# Patient Record
Sex: Female | Born: 1985 | Race: White | Hispanic: No | Marital: Married | Smoking: Never smoker
Health system: Southern US, Community
[De-identification: ages and names within clinical notes are randomized; demographics above are authoritative.]

## PROBLEM LIST (undated history)

## (undated) DIAGNOSIS — Z789 Other specified health status: Secondary | ICD-10-CM

## (undated) HISTORY — PX: NO PAST SURGERIES: SHX2092

---

## 2022-02-22 LAB — OB RESULTS CONSOLE HIV ANTIBODY (ROUTINE TESTING): HIV: NONREACTIVE

## 2022-02-22 LAB — OB RESULTS CONSOLE RUBELLA ANTIBODY, IGM: Rubella: IMMUNE

## 2022-02-22 LAB — OB RESULTS CONSOLE HEPATITIS B SURFACE ANTIGEN: Hepatitis B Surface Ag: NEGATIVE

## 2022-02-22 LAB — OB RESULTS CONSOLE ABO/RH: "RH Type ": NEGATIVE

## 2022-03-08 LAB — OB RESULTS CONSOLE GC/CHLAMYDIA
Chlamydia: NEGATIVE
Neisseria Gonorrhea: NEGATIVE

## 2022-05-17 ENCOUNTER — Inpatient Hospital Stay (HOSPITAL_BASED_OUTPATIENT_CLINIC_OR_DEPARTMENT_OTHER): Payer: BC Managed Care – PPO

## 2022-05-17 ENCOUNTER — Inpatient Hospital Stay (HOSPITAL_COMMUNITY)
Admission: AD | Admit: 2022-05-17 | Discharge: 2022-05-17 | Disposition: A | Discharge status: Home / Self Care | Payer: BC Managed Care – PPO | Attending: Obstetrics and Gynecology | Admitting: Obstetrics and Gynecology

## 2022-05-17 ENCOUNTER — Encounter (HOSPITAL_COMMUNITY): Payer: Self-pay | Admitting: Obstetrics and Gynecology

## 2022-05-17 ENCOUNTER — Other Ambulatory Visit: Payer: Self-pay

## 2022-05-17 DIAGNOSIS — O9A212 Injury, poisoning and certain other consequences of external causes complicating pregnancy, second trimester: Secondary | ICD-10-CM | POA: Diagnosis not present

## 2022-05-17 DIAGNOSIS — Z3A20 20 weeks gestation of pregnancy: Secondary | ICD-10-CM | POA: Diagnosis not present
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
## 2022-07-05 LAB — OB RESULTS CONSOLE HIV ANTIBODY (ROUTINE TESTING): HIV: NONREACTIVE

## 2022-07-25 ENCOUNTER — Inpatient Hospital Stay (HOSPITAL_COMMUNITY)
Admission: AD | Admit: 2022-07-25 | Discharge: 2022-07-25 | Disposition: A | Discharge status: Home / Self Care | Payer: BC Managed Care – PPO | Attending: Obstetrics and Gynecology | Admitting: Obstetrics and Gynecology

## 2022-07-25 ENCOUNTER — Encounter (HOSPITAL_COMMUNITY): Payer: Self-pay | Admitting: Obstetrics and Gynecology

## 2022-07-25 DIAGNOSIS — Z3A30 30 weeks gestation of pregnancy: Secondary | ICD-10-CM

## 2022-07-25 DIAGNOSIS — O133 Gestational [pregnancy-induced] hypertension without significant proteinuria, third trimester: Secondary | ICD-10-CM | POA: Diagnosis present

## 2022-07-25 LAB — COMPREHENSIVE METABOLIC PANEL
ALT: 27 U/L (ref 0–44)
AST: 19 U/L (ref 15–41)
Albumin: 2.6 g/dL — ABNORMAL LOW (ref 3.5–5.0)
Alkaline Phosphatase: 76 U/L (ref 38–126)
Anion gap: 9 (ref 5–15)
BUN: 8 mg/dL (ref 6–20)
CO2: 22 mmol/L (ref 22–32)
Calcium: 9 mg/dL (ref 8.9–10.3)
Chloride: 104 mmol/L (ref 98–111)
Creatinine, Ser: 0.99 mg/dL (ref 0.44–1.00)
GFR, Estimated: >60 mL/min (ref >60)
Glucose, Bld: 82 mg/dL (ref 70–99)
Potassium: 4.4 mmol/L (ref 3.5–5.1)
Sodium: 135 mmol/L (ref 135–145)
Total Bilirubin: 0.6 mg/dL (ref 0.3–1.2)
Total Protein: 6 g/dL — ABNORMAL LOW (ref 6.5–8.1)

## 2022-07-25 LAB — URINALYSIS, ROUTINE W REFLEX MICROSCOPIC
Bilirubin Urine: NEGATIVE
Glucose, UA: NEGATIVE mg/dL
Hgb urine dipstick: NEGATIVE
Ketones, ur: NEGATIVE mg/dL
Leukocytes,Ua: NEGATIVE
Nitrite: NEGATIVE
Protein, ur: NEGATIVE mg/dL
Specific Gravity, Urine: <1.005 — ABNORMAL LOW (ref 1.005–1.030)
pH: 6 (ref 5.0–8.0)

## 2022-07-25 LAB — PROTEIN / CREATININE RATIO, URINE
Creatinine, Urine: 35 mg/dL
Total Protein, Urine: <6 mg/dL

## 2022-07-25 LAB — CBC
HCT: 38.2 % (ref 36.0–46.0)
Hemoglobin: 13 g/dL (ref 12.0–15.0)
MCH: 30.9 pg (ref 26.0–34.0)
MCHC: 34 g/dL (ref 30.0–36.0)
MCV: 90.7 fL (ref 80.0–100.0)
Platelets: 212 10*3/uL (ref 150–400)
RBC: 4.21 MIL/uL (ref 3.87–5.11)
RDW: 13 % (ref 11.5–15.5)
WBC: 14.5 10*3/uL — ABNORMAL HIGH (ref 4.0–10.5)
nRBC: 0 % (ref 0.0–0.2)

## 2022-07-25 NOTE — MAU Provider Note (Signed)
History     CSN: [id number]  Arrival date and time: 07/25/22 1810   None     Chief Complaint  Patient presents with   Hypertension   HPI Jody Griffin is a 36 y.o. G1P0 at [redacted]w[redacted]d who presents to MAU for elevated BP. Patient reports she was in office on Wednesday for routine prenatal visit and BP was found to be 130s/90s. Patient reports she was instructed to keep an eye on BP's over the weekend. Took BP today and it was found 140s/90s. Went to [employer] and on the way home stopped at the fire department to have BP checked and it was found to be 160s/90s-110s. She denies headache, vision changes, RUQ/epigastric pain, or significant swelling. No contractions, vaginal bleeding, or leaking fluid. Endorses normal fetal movement.  Patient receives prenatal care at [facility].   OB History     Gravida  1   Para      Term      Preterm      AB      Living         SAB      IAB      Ectopic      Multiple      Live Births              History reviewed. No pertinent past medical history.  History reviewed. No pertinent surgical history.  Family History  Problem Relation Age of Onset   Factor V Leiden deficiency Mother    Non-Hodgkin's lymphoma Father        CLL    Social History   Tobacco Use   Smoking status: Never   Smokeless tobacco: Never  Vaping Use   Vaping Use: Never used  Substance Use Topics   Alcohol use: Never   Drug use: Never    Allergies: No Known Allergies  Medications Prior to Admission  Medication Sig Dispense Refill Last Dose   prenatal vitamin w/FE, FA (PRENATAL 1 + 1) 27-1 MG TABS tablet Take 1 tablet by mouth daily at 12 noon.      Review of Systems  Constitutional: Negative.   Respiratory: Negative.    Gastrointestinal: Negative.   Genitourinary: Negative.   Musculoskeletal: Negative.   Neurological: Negative.    Physical Exam  Patient Vitals for the past 24 hrs:  BP Temp Temp src Pulse Resp SpO2 Height  Weight  07/25/22 2000 (!) 137/101 -- -- 80 -- 98 % -- --  07/25/22 1950 -- -- -- -- -- 96 % -- --  07/25/22 1945 (!) 147/98 -- -- 76 -- 97 % -- --  07/25/22 1940 -- -- -- -- -- 97 % -- --  07/25/22 1930 (!) 142/97 -- -- 79 -- 97 % -- --  07/25/22 1920 -- -- -- -- -- 98 % -- --  07/25/22 1915 (!) 157/104 -- -- 80 -- 99 % -- --  07/25/22 1910 -- -- -- -- -- 96 % -- --  07/25/22 1905 -- -- -- -- -- 98 % -- --  07/25/22 1900 (!) 149/103 -- -- 79 -- 97 % -- --  07/25/22 1855 -- -- -- -- -- 98 % -- --  07/25/22 1850 -- -- -- -- -- 98 % -- --  07/25/22 1846 (!) 153/91 -- -- 83 -- -- -- --  07/25/22 1845 -- -- -- -- -- 98 % -- --  07/25/22 1825 (!) 153/103 98.2 F (36.8 C) Oral 77 18 99 %  5\' 11"  (1.803 m) 134.6 kg   Physical Exam Vitals and nursing note reviewed.  Constitutional:      General: She is not in acute distress.    Appearance: She is obese.  Eyes:     Extraocular Movements: Extraocular movements intact.     Pupils: Pupils are equal, round, and reactive to light.  Cardiovascular:     Rate and Rhythm: Normal rate.  Pulmonary:     Effort: Pulmonary effort is normal.  Abdominal:     Palpations: Abdomen is soft.     Tenderness: There is no abdominal tenderness.     Comments: Gravid   Musculoskeletal:        General: Normal range of motion.  Skin:    General: Skin is warm and dry.  Neurological:     General: No focal deficit present.     Mental Status: She is alert and oriented to person, place, and time.  Psychiatric:        Mood and Affect: Mood normal.        Behavior: Behavior normal.        Judgment: Judgment normal.    NST FHR: 150bpm, moderate variability, +15x15 accels, no decels Toco: quiet  MAU Course  Procedures   MDM CBC, CMP, and UPCR unremarkable BP's mild range; patient asymptomatic NST reassuring for gestational age, toco quiet Spoke with Dr. , patient to call office for BP check on Tuesday. Patient given strict return  precautions  Assessment and Plan  [redacted] weeks gestation of pregnancy Gestational hypertension  - Discharge home in stable condition - Strict return precautions reviewed - Follow up in office on Tuesday for BP check - Return to MAU sooner or as needed for new/worsening symptoms   Tuesday, CNM 07/25/22 8:26 PM

## 2022-07-25 NOTE — MAU Note (Signed)
Jody Griffin is a 36 y.o. at [redacted]w[redacted]d here in MAU reporting: BP elevated earlier this week. Has been checking at home. 148/90 at home, went to [facility] to recheck 160/100.  On call nurse instructed them to come in. Denies HA, visual changes, epigastric pain or increase in swelling. Denies vag bleeding or LOF, reports +FM.  Onset of complaint: this afternoon Pain score: none Vitals:   07/25/22 1825  BP: (!) 153/103  Pulse: 77  Resp: 18  Temp: 98.2 F (36.8 C)  SpO2: 99%     FHT:146 Lab orders placed from triage:  urine/PCR

## 2022-07-29 ENCOUNTER — Inpatient Hospital Stay (HOSPITAL_COMMUNITY)
Admission: AD | Admit: 2022-07-29 | Discharge: 2022-07-29 | Disposition: A | Discharge status: Home / Self Care | Payer: BC Managed Care – PPO | Attending: Obstetrics & Gynecology | Admitting: Obstetrics & Gynecology

## 2022-07-29 ENCOUNTER — Encounter (HOSPITAL_COMMUNITY): Payer: Self-pay | Admitting: Obstetrics & Gynecology

## 2022-07-29 ENCOUNTER — Other Ambulatory Visit: Payer: Self-pay

## 2022-07-29 DIAGNOSIS — O133 Gestational [pregnancy-induced] hypertension without significant proteinuria, third trimester: Secondary | ICD-10-CM | POA: Diagnosis present

## 2022-07-29 DIAGNOSIS — Z3689 Encounter for other specified antenatal screening: Secondary | ICD-10-CM | POA: Diagnosis not present

## 2022-07-29 DIAGNOSIS — Z3A31 31 weeks gestation of pregnancy: Secondary | ICD-10-CM | POA: Diagnosis not present

## 2022-07-29 HISTORY — DX: Other specified health status: Z78.9

## 2022-07-29 LAB — URINALYSIS, ROUTINE W REFLEX MICROSCOPIC
Bilirubin Urine: NEGATIVE
Glucose, UA: NEGATIVE mg/dL
Hgb urine dipstick: NEGATIVE
Ketones, ur: NEGATIVE mg/dL
Leukocytes,Ua: NEGATIVE
Nitrite: NEGATIVE
Protein, ur: NEGATIVE mg/dL
Specific Gravity, Urine: 1.011 (ref 1.005–1.030)
pH: 6 (ref 5.0–8.0)

## 2022-07-29 LAB — COMPREHENSIVE METABOLIC PANEL
ALT: 35 U/L (ref 0–44)
AST: 26 U/L (ref 15–41)
Albumin: 2.8 g/dL — ABNORMAL LOW (ref 3.5–5.0)
Alkaline Phosphatase: 79 U/L (ref 38–126)
Anion gap: 6 (ref 5–15)
BUN: 7 mg/dL (ref 6–20)
CO2: 22 mmol/L (ref 22–32)
Calcium: 9.3 mg/dL (ref 8.9–10.3)
Chloride: 109 mmol/L (ref 98–111)
Creatinine, Ser: 0.87 mg/dL (ref 0.44–1.00)
GFR, Estimated: >60 mL/min (ref >60)
Glucose, Bld: 85 mg/dL (ref 70–99)
Potassium: 4.7 mmol/L (ref 3.5–5.1)
Sodium: 137 mmol/L (ref 135–145)
Total Bilirubin: 0.3 mg/dL (ref 0.3–1.2)
Total Protein: 6.6 g/dL (ref 6.5–8.1)

## 2022-07-29 LAB — PROTEIN / CREATININE RATIO, URINE
Creatinine, Urine: 127 mg/dL
Protein Creatinine Ratio: 0.09 mg/mg{Cre} (ref 0.00–0.15)
Total Protein, Urine: 11 mg/dL

## 2022-07-29 LAB — CBC
HCT: 40.8 % (ref 36.0–46.0)
Hemoglobin: 13.9 g/dL (ref 12.0–15.0)
MCH: 30.6 pg (ref 26.0–34.0)
MCHC: 34.1 g/dL (ref 30.0–36.0)
MCV: 89.9 fL (ref 80.0–100.0)
Platelets: 194 10*3/uL (ref 150–400)
RBC: 4.54 MIL/uL (ref 3.87–5.11)
RDW: 12.8 % (ref 11.5–15.5)
WBC: 13.8 10*3/uL — ABNORMAL HIGH (ref 4.0–10.5)
nRBC: 0 % (ref 0.0–0.2)

## 2022-07-29 NOTE — MAU Note (Signed)
Jody Griffin is a 36 y.o. at [redacted]w[redacted]d here in MAU reporting: had elevated BP and protein in urine at office visit today, sent for BP evaluation.  Denies H/A, epigastric pain, and visual disturbances.   Endorses +FM.  Denies VB or LOF. LMP: N/A Onset of complaint: today Pain score: 0 Vitals:   07/29/22 1007  BP: (!) 139/99  Pulse: 88  Resp: 19  Temp: 97.9 F (36.6 C)  SpO2: 99%     [id number] d/t maternal apparel Lab orders placed from triage:   UA

## 2022-07-29 NOTE — MAU Provider Note (Signed)
Chief Complaint:  BP Evaluation   Event Date/Time   First Provider Initiated Contact with Patient 07/29/22 1028     HPI: Jody Griffin is a 36 y.o. G1P0 at [redacted]w[redacted]d who presents to maternity admissions reporting increased blood pressure at the office with proteinuria, sent here from [facility] for preeclampsia workup. Had one on Sunday and was diagnosed with gestational hypertension. Denies headache, visual disturbances, epigastric pain or excess swelling Denies vaginal bleeding, leaking of fluid, decreased fetal movement, fever, falls, or recent illness.   Pregnancy Course: Receives care from [facility], prenatal records reviewed  Past Medical History:  Diagnosis Date   Medical history non-contributory    OB History  Gravida Para Term Preterm AB Living  1            SAB IAB Ectopic Multiple Live Births               # Outcome Date GA Lbr Len/2nd Weight Sex Delivery Anes PTL Lv  1 Current            Past Surgical History:  Procedure Laterality Date   NO PAST SURGERIES     Family History  Problem Relation Age of Onset   Factor V Leiden deficiency Mother    Non-Hodgkin's lymphoma Father        CLL   Social History   Tobacco Use   Smoking status: Never   Smokeless tobacco: Never  Vaping Use   Vaping Use: Never used  Substance Use Topics   Alcohol use: Never   Drug use: Never   No Known Allergies No medications prior to admission.   I have reviewed patient's Past Medical Hx, Surgical Hx, Family Hx, Social Hx, medications and allergies.   ROS:  Pertinent items noted in HPI and remainder of comprehensive ROS otherwise negative.   Physical Exam  Patient Vitals for the past 24 hrs:  BP Temp Temp src Pulse Resp SpO2 Height Weight  07/29/22 1245 (!) 137/90 -- -- 95 14 97 % -- --  07/29/22 1232 139/89 -- -- 83 -- -- -- --  07/29/22 1216 (!) 141/100 -- -- 85 -- -- -- --  07/29/22 1201 (!) 133/97 -- -- 75 -- -- -- --  07/29/22 1146 (!) 138/97 -- -- 79 --  -- -- --  07/29/22 1131 (!) 139/95 -- -- 80 -- -- -- --  07/29/22 1116 (!) 135/95 -- -- 83 -- -- -- --  07/29/22 1101 (!) 133/94 -- -- 80 -- -- -- --  07/29/22 1046 (!) 139/92 -- -- 84 -- -- -- --  07/29/22 1030 (!) 129/92 -- -- 86 -- 97 % -- --  07/29/22 1024 (!) 125/91 -- -- 84 -- -- -- --  07/29/22 1007 (!) 139/99 97.9 F (36.6 C) Oral 88 19 99 % -- --  07/29/22 1002 -- -- -- -- -- -- 5\' 10"  (1.778 m) 293 lb 1.6 oz (132.9 kg)   Constitutional: Well-developed, well-nourished female in no acute distress.  Cardiovascular: normal rate & rhythm, warm and well-perfused Respiratory: normal effort, no problems with respiration noted GI: Abd soft, non-tender, gravid appropriate for gestational age MS: Extremities nontender, no edema, normal ROM Neurologic: Alert and oriented x 4.  GU: no CVA tenderness Pelvic: exam deferred  Fetal Tracing: reactive Baseline: 140 Variability: moderate Accelerations: 15x15 Decelerations: none Toco: none   Labs: Results for orders placed or performed during the hospital encounter of 07/29/22 (from the past 24 hour(s))  Urinalysis, Routine  w reflex microscopic Urine, Clean Catch     Status: Abnormal   Collection Time: 07/29/22 10:22 AM  Result Value Ref Range   Color, Urine YELLOW YELLOW   APPearance HAZY (A) CLEAR   Specific Gravity, Urine 1.011 1.005 - 1.030   pH 6.0 5.0 - 8.0   Glucose, UA NEGATIVE NEGATIVE mg/dL   Hgb urine dipstick NEGATIVE NEGATIVE   Bilirubin Urine NEGATIVE NEGATIVE   Ketones, ur NEGATIVE NEGATIVE mg/dL   Protein, ur NEGATIVE NEGATIVE mg/dL   Nitrite NEGATIVE NEGATIVE   Leukocytes,Ua NEGATIVE NEGATIVE  Protein / creatinine ratio, urine     Status: None   Collection Time: 07/29/22 10:22 AM  Result Value Ref Range   Creatinine, Urine 127 mg/dL   Total Protein, Urine 11 mg/dL   Protein Creatinine Ratio 0.09 0.00 - 0.15 mg/mg[Cre]  CBC     Status: Abnormal   Collection Time: 07/29/22 11:32 AM  Result Value Ref Range   WBC  13.8 (H) 4.0 - 10.5 K/uL   RBC 4.54 3.87 - 5.11 MIL/uL   Hemoglobin 13.9 12.0 - 15.0 g/dL   HCT 27.2 53.6 - 64.4 %   MCV 89.9 80.0 - 100.0 fL   MCH 30.6 26.0 - 34.0 pg   MCHC 34.1 30.0 - 36.0 g/dL   RDW 03.4 74.2 - 59.5 %   Platelets 194 150 - 400 K/uL   nRBC 0.0 0.0 - 0.2 %  Comprehensive metabolic panel     Status: Abnormal   Collection Time: 07/29/22 11:32 AM  Result Value Ref Range   Sodium 137 135 - 145 mmol/L   Potassium 4.7 3.5 - 5.1 mmol/L   Chloride 109 98 - 111 mmol/L   CO2 22 22 - 32 mmol/L   Glucose, Bld 85 70 - 99 mg/dL   BUN 7 6 - 20 mg/dL   Creatinine, Ser 6.38 0.44 - 1.00 mg/dL   Calcium 9.3 8.9 - 75.6 mg/dL   Total Protein 6.6 6.5 - 8.1 g/dL   Albumin 2.8 (L) 3.5 - 5.0 g/dL   AST 26 15 - 41 U/L   ALT 35 0 - 44 U/L   Alkaline Phosphatase 79 38 - 126 U/L   Total Bilirubin 0.3 0.3 - 1.2 mg/dL   GFR, Estimated >43 >32 mL/min   Anion gap 6 5 - 15   Imaging:  No results found.  MAU Course: Orders Placed This Encounter  Procedures   Urinalysis, Routine w reflex microscopic Urine, Clean Catch   CBC   Comprehensive metabolic panel   Protein / creatinine ratio, urine   Discharge patient   No orders of the defined types were placed in this encounter.  MDM: BP elevated but not severe range, no s/sx of PEC, no proteinuria on UA in MAU. CBC, CMP, P:Cr ordered, all normal and BP remained under severe range. Discussed delivery in the 37th week and dietary/lifestyle adjustments to keep her BP in range or at least below severe range. Explained s/sx of PEC including severe headache unresponsive to 1000mg  Tylenol, visual disturbances, epigastric pain and sudden increased edema.  Assessment: 1. Gestational hypertension, third trimester   2. NST (non-stress test) reactive   3. [redacted] weeks gestation of pregnancy    Plan: Discharge home in stable condition with strong preeclampsia precautions.     Follow-up Information     [facility] Follow  up.   Why: as scheduled for ongoing prenatal care Contact information: [address] [place]  [address] [phone]                 Allergies as of 07/29/2022   No Known Allergies      Medication List     TAKE these medications    prenatal vitamin w/FE, FA 27-1 MG Tabs tablet Take 1 tablet by mouth daily at 12 noon.       Edd Arbour, CNM, MSN, IBCLC Certified Nurse Midwife, [facility]

## 2022-08-19 ENCOUNTER — Encounter (HOSPITAL_COMMUNITY): Payer: Self-pay | Admitting: Obstetrics and Gynecology

## 2022-08-19 ENCOUNTER — Inpatient Hospital Stay (HOSPITAL_COMMUNITY)
Admission: AD | Admit: 2022-08-19 | Discharge: 2022-08-19 | Disposition: A | Discharge status: Home / Self Care | Payer: BC Managed Care – PPO | Attending: Obstetrics and Gynecology | Admitting: Obstetrics and Gynecology

## 2022-08-19 DIAGNOSIS — Z3A34 34 weeks gestation of pregnancy: Secondary | ICD-10-CM | POA: Diagnosis not present

## 2022-08-19 DIAGNOSIS — Z79899 Other long term (current) drug therapy: Secondary | ICD-10-CM | POA: Insufficient documentation

## 2022-08-19 DIAGNOSIS — O09293 Supervision of pregnancy with other poor reproductive or obstetric history, third trimester: Secondary | ICD-10-CM | POA: Insufficient documentation

## 2022-08-19 DIAGNOSIS — O10913 Unspecified pre-existing hypertension complicating pregnancy, third trimester: Secondary | ICD-10-CM | POA: Insufficient documentation

## 2022-08-19 DIAGNOSIS — O133 Gestational [pregnancy-induced] hypertension without significant proteinuria, third trimester: Secondary | ICD-10-CM | POA: Diagnosis present

## 2022-08-19 LAB — CBC
HCT: 41.6 % (ref 36.0–46.0)
Hemoglobin: 14.5 g/dL (ref 12.0–15.0)
MCH: 31.3 pg (ref 26.0–34.0)
MCHC: 34.9 g/dL (ref 30.0–36.0)
MCV: 89.8 fL (ref 80.0–100.0)
Platelets: 163 10*3/uL (ref 150–400)
RBC: 4.63 MIL/uL (ref 3.87–5.11)
RDW: 13.2 % (ref 11.5–15.5)
WBC: 9.8 10*3/uL (ref 4.0–10.5)
nRBC: 0 % (ref 0.0–0.2)

## 2022-08-19 LAB — COMPREHENSIVE METABOLIC PANEL
ALT: 51 U/L — ABNORMAL HIGH (ref 0–44)
AST: 36 U/L (ref 15–41)
Albumin: 2.7 g/dL — ABNORMAL LOW (ref 3.5–5.0)
Alkaline Phosphatase: 93 U/L (ref 38–126)
Anion gap: 9 (ref 5–15)
BUN: 11 mg/dL (ref 6–20)
CO2: 19 mmol/L — ABNORMAL LOW (ref 22–32)
Calcium: 9.1 mg/dL (ref 8.9–10.3)
Chloride: 109 mmol/L (ref 98–111)
Creatinine, Ser: 0.95 mg/dL (ref 0.44–1.00)
GFR, Estimated: >60 mL/min (ref >60)
Glucose, Bld: 88 mg/dL (ref 70–99)
Potassium: 4.7 mmol/L (ref 3.5–5.1)
Sodium: 137 mmol/L (ref 135–145)
Total Bilirubin: 0.8 mg/dL (ref 0.3–1.2)
Total Protein: 6.4 g/dL — ABNORMAL LOW (ref 6.5–8.1)

## 2022-08-19 LAB — URINALYSIS, ROUTINE W REFLEX MICROSCOPIC
Bilirubin Urine: NEGATIVE
Glucose, UA: NEGATIVE mg/dL
Hgb urine dipstick: NEGATIVE
Ketones, ur: NEGATIVE mg/dL
Leukocytes,Ua: NEGATIVE
Nitrite: NEGATIVE
Protein, ur: NEGATIVE mg/dL
Specific Gravity, Urine: 1.009 (ref 1.005–1.030)
pH: 7 (ref 5.0–8.0)

## 2022-08-19 LAB — PROTEIN / CREATININE RATIO, URINE
Creatinine, Urine: 60 mg/dL
Protein Creatinine Ratio: 0.23 mg/mg{Cre} — ABNORMAL HIGH (ref 0.00–0.15)
Total Protein, Urine: 14 mg/dL

## 2022-08-19 NOTE — MAU Note (Signed)
.  Jody Griffin is a 36 y.o. at [redacted]w[redacted]d here in MAU reporting: elevated b/p  153/113 this morning. Had headache last night but none this morning. Just felt a little lightheaded.  Good fetal movement felt.  LMP:  Onset of complaint: this morning Pain score: 0 Vitals:   08/19/22 0929  BP: 117/85  Pulse: 70  Resp: 18  Temp: (!) 97.4 F (36.3 C)     FHT:148 Lab orders placed from triage:

## 2022-08-19 NOTE — MAU Provider Note (Signed)
History     [id number]  Arrival date and time: 08/19/22 8299    Chief Complaint  Patient presents with   Hypertension     HPI Jody Griffin is a 36 y.o. at [redacted]w[redacted]d with PMHx notable for gHTN diagnosed ~2 weeks prior in this pregnancy, Rh neg status, AMA, who presents for elevated blood pressure readings at home.   Review of outside prenatal records from [facility] (in media tab): initial OB visit with mild range BP but normotensive until about a month ago, has had persistent mild range BP's. Started on labetalol 200 BID. Rhogam given at 30 weeks. Has had borderline LFTs and platelets as outpatient.  Last seen in MAU for similar on 07/29/2022 Had normal labs at that time  Today reports she had multiple severe range BP's at home and decided to come to MAU for an evaluation Has been taking labetalol, reports dose was increased to 300 mg BID a few days ago Currently denies headache, vision changes, chest pain, right upper quadrant pain, or lower extremity edema No vaginal bleeding or leaking fluid No contractions Normal fetal movement      OB History     Gravida  1   Para      Term      Preterm      AB      Living         SAB      IAB      Ectopic      Multiple      Live Births              Past Medical History:  Diagnosis Date   Medical history non-contributory     Past Surgical History:  Procedure Laterality Date   NO PAST SURGERIES      Family History  Problem Relation Age of Onset   Factor V Leiden deficiency Mother    Non-Hodgkin's lymphoma Father        CLL    Social History   Socioeconomic History   Marital status: Married    Spouse name: Not on file   Number of children: Not on file   Years of education: Not on file   Highest education level: Not on file  Occupational History   Not on file  Tobacco Use   Smoking status: Never   Smokeless tobacco: Never  Vaping Use   Vaping Use: Never used  Substance and  Sexual Activity   Alcohol use: Never   Drug use: Never   Sexual activity: Yes  Other Topics Concern   Not on file  Social History Narrative   Not on file   Social Determinants of Health   Financial Resource Strain: Not on file  Food Insecurity: Not on file  Transportation Needs: Not on file  Physical Activity: Not on file  Stress: Not on file  Social Connections: Not on file  Intimate Partner Violence: Not on file    No Known Allergies  No current facility-administered medications on file prior to encounter.   Current Outpatient Medications on File Prior to Encounter  Medication Sig Dispense Refill   labetalol (NORMODYNE) 300 MG tablet Take 300 mg by mouth 2 (two) times daily.     prenatal vitamin w/FE, FA (PRENATAL 1 + 1) 27-1 MG TABS tablet Take 1 tablet by mouth daily at 12 noon.       ROS Pertinent positives and negative per HPI, all others reviewed and negative  Physical Exam  BP 122/87   Pulse 67   Temp (!) 97.4 F (36.3 C)   Resp 18   Ht 5\' 10"  (1.778 m)   Wt 133.8 kg   BMI 42.33 kg/m   Patient Vitals for the past 24 hrs:  BP Temp Pulse Resp Height Weight  08/19/22 1131 122/87 -- 67 -- -- --  08/19/22 1116 130/85 -- 70 -- -- --  08/19/22 1101 137/88 -- 67 -- -- --  08/19/22 1046 (!) 131/93 -- 68 -- -- --  08/19/22 1031 (!) 134/94 -- 71 -- -- --  08/19/22 1023 (!) 127/90 -- 69 -- -- --  08/19/22 0952 114/83 -- 73 -- -- --  08/19/22 0929 117/85 (!) 97.4 F (36.3 C) 70 18 5\' 10"  (1.778 m) 133.8 kg    Physical Exam Vitals reviewed.  Constitutional:      General: She is not in acute distress.    Appearance: She is well-developed. She is not diaphoretic.  Eyes:     General: No scleral icterus. Pulmonary:     Effort: Pulmonary effort is normal. No respiratory distress.  Skin:    General: Skin is warm and dry.  Neurological:     Mental Status: She is alert.     Coordination: Coordination normal.      Cervical Exam    Bedside  Ultrasound Not done  My interpretation: n/a  FHT Baseline 140, moderate variability, +accels, no decels Toco: flat Cat: I  Labs Results for orders placed or performed during the hospital encounter of 08/19/22 (from the past 24 hour(s))  Urinalysis, Routine w reflex microscopic Urine, Clean Catch     Status: Abnormal   Collection Time: 08/19/22 10:14 AM  Result Value Ref Range   Color, Urine YELLOW YELLOW   APPearance HAZY (A) CLEAR   Specific Gravity, Urine 1.009 1.005 - 1.030   pH 7.0 5.0 - 8.0   Glucose, UA NEGATIVE NEGATIVE mg/dL   Hgb urine dipstick NEGATIVE NEGATIVE   Bilirubin Urine NEGATIVE NEGATIVE   Ketones, ur NEGATIVE NEGATIVE mg/dL   Protein, ur NEGATIVE NEGATIVE mg/dL   Nitrite NEGATIVE NEGATIVE   Leukocytes,Ua NEGATIVE NEGATIVE  Protein / creatinine ratio, urine     Status: Abnormal   Collection Time: 08/19/22 10:14 AM  Result Value Ref Range   Creatinine, Urine 60 mg/dL   Total Protein, Urine 14 mg/dL   Protein Creatinine Ratio 0.23 (H) 0.00 - 0.15 mg/mg[Cre]  CBC     Status: None   Collection Time: 08/19/22 10:26 AM  Result Value Ref Range   WBC 9.8 4.0 - 10.5 K/uL   RBC 4.63 3.87 - 5.11 MIL/uL   Hemoglobin 14.5 12.0 - 15.0 g/dL   HCT 08/21/22 08/21/22 - 64.4 %   MCV 89.8 80.0 - 100.0 fL   MCH 31.3 26.0 - 34.0 pg   MCHC 34.9 30.0 - 36.0 g/dL   RDW 03.4 74.2 - 59.5 %   Platelets 163 150 - 400 K/uL   nRBC 0.0 0.0 - 0.2 %  Comprehensive metabolic panel     Status: Abnormal   Collection Time: 08/19/22 10:26 AM  Result Value Ref Range   Sodium 137 135 - 145 mmol/L   Potassium 4.7 3.5 - 5.1 mmol/L   Chloride 109 98 - 111 mmol/L   CO2 19 (L) 22 - 32 mmol/L   Glucose, Bld 88 70 - 99 mg/dL   BUN 11 6 - 20 mg/dL   Creatinine, Ser 75.6 0.44 - 1.00 mg/dL   Calcium  9.1 8.9 - 10.3 mg/dL   Total Protein 6.4 (L) 6.5 - 8.1 g/dL   Albumin 2.7 (L) 3.5 - 5.0 g/dL   AST 36 15 - 41 U/L   ALT 51 (H) 0 - 44 U/L   Alkaline Phosphatase 93 38 - 126 U/L   Total Bilirubin 0.8  0.3 - 1.2 mg/dL   GFR, Estimated >40 >98 mL/min   Anion gap 9 5 - 15    Imaging No results found.  MAU Course  Procedures Lab Orders         Urinalysis, Routine w reflex microscopic Urine, Clean Catch         Protein / creatinine ratio, urine         CBC         Comprehensive metabolic panel    No orders of the defined types were placed in this encounter.  Imaging Orders  No imaging studies ordered today    MDM moderate  Assessment and Plan  #Gestational Hypertension #[redacted] weeks gestation of pregnancy Labs unremarkable/baseline. Asymptomatic. No signs of severe pre-e at this time.Discussed return precautions.   #FWB FHT Cat I NST: Reactive   Dispo: discharged to home in stable condition.   Venora Maples, MD/MPH 08/19/22 12:20 PM  Allergies as of 08/19/2022   No Known Allergies      Medication List     TAKE these medications    labetalol 300 MG tablet Commonly known as: NORMODYNE Take 300 mg by mouth 2 (two) times daily.   prenatal vitamin w/FE, FA 27-1 MG Tabs tablet Take 1 tablet by mouth daily at 12 noon.

## 2022-08-25 ENCOUNTER — Encounter (HOSPITAL_COMMUNITY): Payer: Self-pay | Admitting: Obstetrics and Gynecology

## 2022-08-25 ENCOUNTER — Inpatient Hospital Stay (EMERGENCY_DEPARTMENT_HOSPITAL)
Admission: AD | Admit: 2022-08-25 | Discharge: 2022-08-25 | Disposition: A | Discharge status: Home / Self Care | Payer: BC Managed Care – PPO | Source: Home / Self Care | Attending: Obstetrics and Gynecology | Admitting: Obstetrics and Gynecology

## 2022-08-25 DIAGNOSIS — O133 Gestational [pregnancy-induced] hypertension without significant proteinuria, third trimester: Secondary | ICD-10-CM | POA: Insufficient documentation

## 2022-08-25 DIAGNOSIS — O09513 Supervision of elderly primigravida, third trimester: Secondary | ICD-10-CM | POA: Insufficient documentation

## 2022-08-25 DIAGNOSIS — Z3A35 35 weeks gestation of pregnancy: Secondary | ICD-10-CM | POA: Insufficient documentation

## 2022-08-25 DIAGNOSIS — O1414 Severe pre-eclampsia complicating childbirth: Secondary | ICD-10-CM | POA: Diagnosis not present

## 2022-08-25 LAB — URINALYSIS, ROUTINE W REFLEX MICROSCOPIC
Bilirubin Urine: NEGATIVE
Glucose, UA: NEGATIVE mg/dL
Hgb urine dipstick: NEGATIVE
Ketones, ur: NEGATIVE mg/dL
Leukocytes,Ua: NEGATIVE
Nitrite: NEGATIVE
Protein, ur: NEGATIVE mg/dL
Specific Gravity, Urine: 1.023 (ref 1.005–1.030)
pH: 5 (ref 5.0–8.0)

## 2022-08-25 LAB — COMPREHENSIVE METABOLIC PANEL
ALT: 46 U/L — ABNORMAL HIGH (ref 0–44)
AST: 36 U/L (ref 15–41)
Albumin: 2.7 g/dL — ABNORMAL LOW (ref 3.5–5.0)
Alkaline Phosphatase: 105 U/L (ref 38–126)
Anion gap: 4 — ABNORMAL LOW (ref 5–15)
BUN: 15 mg/dL (ref 6–20)
CO2: 21 mmol/L — ABNORMAL LOW (ref 22–32)
Calcium: 8.8 mg/dL — ABNORMAL LOW (ref 8.9–10.3)
Chloride: 109 mmol/L (ref 98–111)
Creatinine, Ser: 1.11 mg/dL — ABNORMAL HIGH (ref 0.44–1.00)
GFR, Estimated: >60 mL/min (ref >60)
Glucose, Bld: 78 mg/dL (ref 70–99)
Potassium: 4.6 mmol/L (ref 3.5–5.1)
Sodium: 134 mmol/L — ABNORMAL LOW (ref 135–145)
Total Bilirubin: 0.5 mg/dL (ref 0.3–1.2)
Total Protein: 6.1 g/dL — ABNORMAL LOW (ref 6.5–8.1)

## 2022-08-25 LAB — CBC
HCT: 40.9 % (ref 36.0–46.0)
Hemoglobin: 13.6 g/dL (ref 12.0–15.0)
MCH: 30.8 pg (ref 26.0–34.0)
MCHC: 33.3 g/dL (ref 30.0–36.0)
MCV: 92.7 fL (ref 80.0–100.0)
Platelets: 165 10*3/uL (ref 150–400)
RBC: 4.41 MIL/uL (ref 3.87–5.11)
RDW: 13.5 % (ref 11.5–15.5)
WBC: 12.3 10*3/uL — ABNORMAL HIGH (ref 4.0–10.5)
nRBC: 0 % (ref 0.0–0.2)

## 2022-08-25 LAB — PROTEIN / CREATININE RATIO, URINE
Creatinine, Urine: 210 mg/dL
Protein Creatinine Ratio: 0.1 mg/mg{Cre} (ref 0.00–0.15)
Total Protein, Urine: 21 mg/dL

## 2022-08-25 NOTE — MAU Provider Note (Signed)
History     CSN: [id number]  Arrival date and time: 08/25/22 1452   None    Chief Complaint  Patient presents with   sent over from office for lab work follow up   HPI Jody Griffin is a 36 y.o. G1P0 at [redacted]w[redacted]d who's pregnancy has been complicated by gHTN reports that she received a phone call regarding lab results that she had drawn in the office yesterday. She reports her OB called her today and left a voicemail saying that he was concerned about her lab results and needed to be evaluated here today. She is unsure which labs her OB was concerned about. She reports BP's have been 140s/90s on average. She takes Labetalol 300mg  BID. She denies headache, vision changes, RUQ/epigastric pain. No contractions, vaginal bleeding or leaking fluid. She endorses normal active fetal movement.  She receives prenatal care at [facility] and next appointment is scheduled on Friday 9/22.   OB History     Gravida  1   Para      Term      Preterm      AB      Living         SAB      IAB      Ectopic      Multiple      Live Births              Past Medical History:  Diagnosis Date   Medical history non-contributory     Past Surgical History:  Procedure Laterality Date   NO PAST SURGERIES      Family History  Problem Relation Age of Onset   Factor V Leiden deficiency Mother    Non-Hodgkin's lymphoma Father        CLL    Social History   Tobacco Use   Smoking status: Never   Smokeless tobacco: Never  Vaping Use   Vaping Use: Never used  Substance Use Topics   Alcohol use: Never   Drug use: Never   Allergies: No Known Allergies  No medications prior to admission.   Review of Systems  Constitutional: Negative.   Respiratory: Negative.    Cardiovascular: Negative.   Gastrointestinal: Negative.   Genitourinary: Negative.   Neurological: Negative.    Physical Exam   Patient Vitals for the past 24 hrs:  BP Temp Temp src Pulse Resp SpO2  Height Weight  08/25/22 1817 (!) 152/96 -- -- 60 -- -- -- --  08/25/22 1746 (!) 144/94 -- -- 67 -- -- -- --  08/25/22 1730 (!) 144/97 -- -- 71 -- 98 % -- --  08/25/22 1715 (!) 164/109 -- -- 68 -- 98 % -- --  08/25/22 1700 (!) 156/106 -- -- 65 -- 97 % -- --  08/25/22 1645 (!) 148/94 -- -- 63 -- 98 % -- --  08/25/22 1630 (!) 140/95 -- -- 62 -- 98 % -- --  08/25/22 1615 (!) 138/94 -- -- 64 -- 97 % -- --  08/25/22 1600 (!) 139/92 -- -- 67 -- 98 % -- --  08/25/22 1545 (!) 142/90 -- -- 63 -- 98 % -- --  08/25/22 1530 (!) 151/95 -- -- 67 -- 97 % -- --  08/25/22 1515 (!) 153/93 -- -- 65 -- 98 % -- --  08/25/22 1508 (!) 149/96 98 F (36.7 C) Oral 65 17 98 % 5\' 10"  (1.778 m) 134 kg   Physical Exam Vitals and nursing note reviewed.  Constitutional:  General: She is not in acute distress.    Appearance: She is obese.  Eyes:     Extraocular Movements: Extraocular movements intact.     Pupils: Pupils are equal, round, and reactive to light.  Cardiovascular:     Rate and Rhythm: Normal rate.  Pulmonary:     Effort: Pulmonary effort is normal.  Abdominal:     Palpations: Abdomen is soft.     Tenderness: There is no abdominal tenderness.     Comments: gravid  Musculoskeletal:        General: Normal range of motion.     Cervical back: Normal range of motion.  Skin:    General: Skin is warm and dry.  Neurological:     General: No focal deficit present.     Mental Status: She is alert and oriented to person, place, and time.  Psychiatric:        Mood and Affect: Mood normal.        Behavior: Behavior normal.    Dilation: Closed Effacement (%): Thick Exam by:: Maryagnes Amos, CNM  NST FHR: 145 bpm, moderate variability, +15x15 accels, no decels Toco: quiet  MAU Course  Procedures  MDM UA CBC, CMP, UPCR Serial BP's NST  BP's mild range, which is within patient's norm. She did have one severe range BP, however when RN went in room to assess, patient was lying on cuff and  not properly placed on arm so I believe this to be an incorrect measurement. CBC and UPCR unremarkable. CMP shows improvement in LFT's. AST-36 and ALT-46 today. Creatinine is elevated at 1.1 which is new. Patient denies headache, vision changes, RUQ/epigastric pain throughout entire MAU visit. She reports some mild cramping that started while here so cervix checked and found to be closed/thick. NST reactive and reassuring.   1825: I discussed patient with Dr. Bridgett Larsson who is okay with patient being discharged home with strict return precautions. Patient will follow up in the office tomorrow for repeat labs and BP check. Dr. Bridgett Larsson to coordinate follow up.   I reviewed strict return precautions in depth with both patient and significant other who both verbalize understanding.    Assessment and Plan  [redacted] weeks gestation of pregnancy Gestational hypertension  - Discharge home in stable condition - Strict return precautions - Follow up with [facility] tomorrow for repeat labs and BP check - Return to MAU as needed for new/worsening symptoms   Renee Harder, CNM 08/25/2022, 7:11 PM

## 2022-08-25 NOTE — MAU Note (Signed)
.  Jody Griffin is a 36 y.o. at [redacted]w[redacted]d here in MAU reporting: she received a call from [facility] office stating that they received her lab results from yesterday and were concerned so they told her to come in today. She is unsure of which labs they are referring to but believes that it was blood work. Denies VB, LOF, or pain. Denies HA, visual changes, RUQ/epigastric pain, or abnormal swelling. Reports good FM. LMP: N/A Onset of complaint: Today Pain score: 0/10 Vitals:   08/25/22 1508  BP: (!) 149/96  Pulse: 65  Resp: 17  Temp: 98 F (36.7 C)  SpO2: 98%     FHT:150 Lab orders placed from triage:  UA

## 2022-08-26 ENCOUNTER — Inpatient Hospital Stay (HOSPITAL_COMMUNITY)
Admission: AD | Admit: 2022-08-26 | Discharge: 2022-08-31 | DRG: 788 | Disposition: A | Discharge status: Home / Self Care | Payer: BC Managed Care – PPO | Attending: Obstetrics and Gynecology | Admitting: Obstetrics and Gynecology

## 2022-08-26 ENCOUNTER — Other Ambulatory Visit: Payer: Self-pay

## 2022-08-26 ENCOUNTER — Encounter (HOSPITAL_COMMUNITY): Payer: Self-pay | Admitting: Obstetrics and Gynecology

## 2022-08-26 DIAGNOSIS — O1414 Severe pre-eclampsia complicating childbirth: Secondary | ICD-10-CM | POA: Diagnosis present

## 2022-08-26 DIAGNOSIS — Z3A35 35 weeks gestation of pregnancy: Secondary | ICD-10-CM

## 2022-08-26 DIAGNOSIS — O321XX0 Maternal care for breech presentation, not applicable or unspecified: Secondary | ICD-10-CM | POA: Diagnosis present

## 2022-08-26 DIAGNOSIS — O1490 Unspecified pre-eclampsia, unspecified trimester: Secondary | ICD-10-CM | POA: Diagnosis present

## 2022-08-26 DIAGNOSIS — O1413 Severe pre-eclampsia, third trimester: Principal | ICD-10-CM

## 2022-08-26 DIAGNOSIS — O1410 Severe pre-eclampsia, unspecified trimester: Secondary | ICD-10-CM | POA: Diagnosis present

## 2022-08-26 LAB — COMPREHENSIVE METABOLIC PANEL
ALT: 50 U/L — ABNORMAL HIGH (ref 0–44)
AST: 38 U/L (ref 15–41)
Albumin: 2.8 g/dL — ABNORMAL LOW (ref 3.5–5.0)
Alkaline Phosphatase: 109 U/L (ref 38–126)
Anion gap: 10 (ref 5–15)
BUN: 16 mg/dL (ref 6–20)
CO2: 20 mmol/L — ABNORMAL LOW (ref 22–32)
Calcium: 9.4 mg/dL (ref 8.9–10.3)
Chloride: 105 mmol/L (ref 98–111)
Creatinine, Ser: 1.03 mg/dL — ABNORMAL HIGH (ref 0.44–1.00)
GFR, Estimated: >60 mL/min (ref >60)
Glucose, Bld: 82 mg/dL (ref 70–99)
Potassium: 3.9 mmol/L (ref 3.5–5.1)
Sodium: 135 mmol/L (ref 135–145)
Total Bilirubin: 0.4 mg/dL (ref 0.3–1.2)
Total Protein: 6.7 g/dL (ref 6.5–8.1)

## 2022-08-26 LAB — CBC
HCT: 40.5 % (ref 36.0–46.0)
Hemoglobin: 14 g/dL (ref 12.0–15.0)
MCH: 31 pg (ref 26.0–34.0)
MCHC: 34.6 g/dL (ref 30.0–36.0)
MCV: 89.6 fL (ref 80.0–100.0)
Platelets: 154 10*3/uL (ref 150–400)
RBC: 4.52 MIL/uL (ref 3.87–5.11)
RDW: 13.3 % (ref 11.5–15.5)
WBC: 13.7 10*3/uL — ABNORMAL HIGH (ref 4.0–10.5)
nRBC: 0 % (ref 0.0–0.2)

## 2022-08-26 LAB — PROTEIN / CREATININE RATIO, URINE
Creatinine, Urine: 100 mg/dL
Protein Creatinine Ratio: 0.13 mg/mg{Cre} (ref 0.00–0.15)
Total Protein, Urine: 13 mg/dL

## 2022-08-26 LAB — TYPE AND SCREEN
ABO/RH(D): A NEG
Antibody Screen: POSITIVE

## 2022-08-26 MED ORDER — LACTATED RINGERS IV SOLN: INTRAVENOUS | Status: DC

## 2022-08-26 MED ORDER — OXYTOCIN BOLUS FROM INFUSION: 333.0000 mL | Freq: Once | INTRAVENOUS | Status: DC

## 2022-08-26 MED ORDER — LABETALOL HCL 200 MG PO TABS
200.0000 mg | ORAL_TABLET | Freq: Two times a day (BID) | ORAL | Status: DC
  Administered 2022-08-26: 200 mg via ORAL
  Filled 2022-08-26: qty 1

## 2022-08-26 MED ORDER — SODIUM CHLORIDE 0.9 % IV SOLN
5.0000 10*6.[IU] | Freq: Once | INTRAVENOUS | Status: AC
  Administered 2022-08-26: 5 10*6.[IU] via INTRAVENOUS
  Filled 2022-08-26: qty 5

## 2022-08-26 MED ORDER — MAGNESIUM SULFATE BOLUS VIA INFUSION
4.0000 g | Freq: Once | INTRAVENOUS | Status: AC
  Administered 2022-08-27: 4 g via INTRAVENOUS
  Filled 2022-08-26: qty 1000

## 2022-08-26 MED ORDER — ACETAMINOPHEN 325 MG PO TABS: 650.0000 mg | ORAL_TABLET | ORAL | Status: DC | PRN

## 2022-08-26 MED ORDER — BETAMETHASONE SOD PHOS & ACET 6 (3-3) MG/ML IJ SUSP
12.0000 mg | INTRAMUSCULAR | Status: DC
  Administered 2022-08-26: 12 mg via INTRAMUSCULAR
  Filled 2022-08-26: qty 5

## 2022-08-26 MED ORDER — LABETALOL HCL 5 MG/ML IV SOLN: 40.0000 mg | INTRAVENOUS | Status: DC | PRN

## 2022-08-26 MED ORDER — OXYCODONE-ACETAMINOPHEN 5-325 MG PO TABS: 1.0000 | ORAL_TABLET | ORAL | Status: DC | PRN

## 2022-08-26 MED ORDER — LABETALOL HCL 5 MG/ML IV SOLN: 80.0000 mg | INTRAVENOUS | Status: DC | PRN

## 2022-08-26 MED ORDER — MISOPROSTOL 25 MCG QUARTER TABLET
25.0000 ug | ORAL_TABLET | Freq: Once | ORAL | Status: AC
  Administered 2022-08-26: 25 ug via ORAL
  Filled 2022-08-26: qty 1

## 2022-08-26 MED ORDER — TERBUTALINE SULFATE 1 MG/ML IJ SOLN: 0.2500 mg | Freq: Once | INTRAMUSCULAR | Status: DC | PRN

## 2022-08-26 MED ORDER — LIDOCAINE HCL (PF) 1 % IJ SOLN: 30.0000 mL | INTRAMUSCULAR | Status: DC | PRN

## 2022-08-26 MED ORDER — OXYTOCIN-SODIUM CHLORIDE 30-0.9 UT/500ML-% IV SOLN: 2.5000 [IU]/h | INTRAVENOUS | Status: DC

## 2022-08-26 MED ORDER — OXYCODONE-ACETAMINOPHEN 5-325 MG PO TABS: 2.0000 | ORAL_TABLET | ORAL | Status: DC | PRN

## 2022-08-26 MED ORDER — HYDROXYZINE HCL 50 MG PO TABS: 50.0000 mg | ORAL_TABLET | Freq: Four times a day (QID) | ORAL | Status: DC | PRN

## 2022-08-26 MED ORDER — SOD CITRATE-CITRIC ACID 500-334 MG/5ML PO SOLN
30.0000 mL | ORAL | Status: DC | PRN
  Administered 2022-08-27: 30 mL via ORAL
  Filled 2022-08-26: qty 30

## 2022-08-26 MED ORDER — HYDRALAZINE HCL 20 MG/ML IJ SOLN: 10.0000 mg | INTRAMUSCULAR | Status: DC | PRN

## 2022-08-26 MED ORDER — ONDANSETRON HCL 4 MG/2ML IJ SOLN: 4.0000 mg | Freq: Four times a day (QID) | INTRAMUSCULAR | Status: DC | PRN

## 2022-08-26 MED ORDER — MAGNESIUM SULFATE 40 GM/1000ML IV SOLN
1.0000 g/h | INTRAVENOUS | Status: DC
  Filled 2022-08-26: qty 1000

## 2022-08-26 MED ORDER — LACTATED RINGERS IV SOLN: 500.0000 mL | INTRAVENOUS | Status: DC | PRN

## 2022-08-26 MED ORDER — PENICILLIN G POT IN DEXTROSE 60000 UNIT/ML IV SOLN
3.0000 10*6.[IU] | INTRAVENOUS | Status: DC
  Administered 2022-08-27: 3 10*6.[IU] via INTRAVENOUS
  Filled 2022-08-26: qty 50

## 2022-08-26 NOTE — H&P (Signed)
OB History and Physical   Jody Griffin is a 36 y.o. female G1P0 presenting for IOL at [redacted]w[redacted]d for preeclampsia with severe features.  She has been follow in the office for mild elevated blood pressures and mild abnormalities in ALT as well as platelets.  She was recently noted to have new onset elevated creatinine to 1.17 (previously 0.85). This was 1.11 in MAU yesterday.  Today she denies persistent headache, vision changes, RUQ or epigastric pain. She reports good fetal movement.  Her most recent ultrasound showed EFW 15%ile and AFI 8 cm.    OB History     Gravida  1   Para      Term      Preterm      AB      Living         SAB      IAB      Ectopic      Multiple      Live Births             Past Medical History:  Diagnosis Date   Medical history non-contributory    Past Surgical History:  Procedure Laterality Date   NO PAST SURGERIES     Family History: family history includes Factor V Leiden deficiency in her mother; Non-Hodgkin's lymphoma in her father. Social History:  reports that she has never smoked. She has never used smokeless tobacco. She reports that she does not drink alcohol and does not use drugs.     Maternal Diabetes: No Genetic Screening: Normal Maternal Ultrasounds/Referrals: Normal Fetal Ultrasounds or other Referrals:  None Maternal Substance Abuse:  No Significant Maternal Medications:  None Significant Maternal Lab Results:  GBS unknown Other Comments:  None  Review of Systems - Patient denies fever, chills, SOB, CP, N/V/D.  History Dilation: Closed Effacement (%): Thick Station: -3 Exam by:: Nadara Mustard, RN Blood pressure (!) 149/89, pulse 71, temperature 97.6 F (36.4 C), temperature source Oral, resp. rate 18, height 5\' 11"  (1.803 m), weight 134.6 kg. Exam Physical Exam   Gen: alert, well appearing, no distress Chest: nonlabored breathing CV: no peripheral edema Abdomen: soft, gravid  Ext: no evidence of  DVT  Prenatal labs: ABO, Rh: --/--/A NEG (09/21 1906) Antibody: POS (09/21 1906) Rubella:   RI RPR:   NR HBsAg:   wnl HIV:   wnl GBS:   unknown  Assessment/Plan: Admit to Labor and Delivery Discussed elevation of BP and new onset elevation in creatinine results in diagnosis of preeclampsia with severe features.  Cytotec for cervical ripening, followed by pitocin, AROM Epidural when desired BMZ x2 PCN for GBS unknown and preterm Low threshold for initiation of Magnesium On admission, Cr with some improvement on 1.03. Will continue to monitor HELLP labs while inpatient. Patient agreeable to plan.  Carlyon Shadow 08/26/2022, 8:50 PM

## 2022-08-27 ENCOUNTER — Inpatient Hospital Stay (HOSPITAL_COMMUNITY): Payer: BC Managed Care – PPO | Admitting: Anesthesiology

## 2022-08-27 ENCOUNTER — Encounter (HOSPITAL_COMMUNITY): Payer: Self-pay | Admitting: Obstetrics and Gynecology

## 2022-08-27 ENCOUNTER — Encounter (HOSPITAL_COMMUNITY)
Admission: AD | Disposition: A | Discharge status: Home / Self Care | Payer: Self-pay | Source: Home / Self Care | Attending: Obstetrics and Gynecology

## 2022-08-27 ENCOUNTER — Other Ambulatory Visit: Payer: Self-pay

## 2022-08-27 DIAGNOSIS — O1410 Severe pre-eclampsia, unspecified trimester: Secondary | ICD-10-CM | POA: Diagnosis present

## 2022-08-27 LAB — CBC
HCT: 42 % (ref 36.0–46.0)
Hemoglobin: 14.5 g/dL (ref 12.0–15.0)
MCH: 30.9 pg (ref 26.0–34.0)
MCHC: 34.5 g/dL (ref 30.0–36.0)
MCV: 89.4 fL (ref 80.0–100.0)
Platelets: 164 10*3/uL (ref 150–400)
RBC: 4.7 MIL/uL (ref 3.87–5.11)
RDW: 13.4 % (ref 11.5–15.5)
WBC: 15.3 10*3/uL — ABNORMAL HIGH (ref 4.0–10.5)
nRBC: 0 % (ref 0.0–0.2)

## 2022-08-27 LAB — RPR: RPR Ser Ql: NONREACTIVE

## 2022-08-27 SURGERY — Surgical Case
Anesthesia: Spinal | Wound class: Clean Contaminated

## 2022-08-27 MED ORDER — MORPHINE SULFATE (PF) 0.5 MG/ML IJ SOLN
INTRAMUSCULAR | Status: DC | PRN
  Administered 2022-08-27: 50 ug via INTRATHECAL

## 2022-08-27 MED ORDER — PHENYLEPHRINE 80 MCG/ML (10ML) SYRINGE FOR IV PUSH (FOR BLOOD PRESSURE SUPPORT)
PREFILLED_SYRINGE | INTRAVENOUS | Status: DC | PRN
  Administered 2022-08-27 (×3): 80 ug via INTRAVENOUS

## 2022-08-27 MED ORDER — ONDANSETRON HCL 4 MG/2ML IJ SOLN
INTRAMUSCULAR | Status: AC
  Filled 2022-08-27: qty 2

## 2022-08-27 MED ORDER — FENTANYL CITRATE (PF) 100 MCG/2ML IJ SOLN
INTRAMUSCULAR | Status: AC
  Filled 2022-08-27: qty 2

## 2022-08-27 MED ORDER — MENTHOL 3 MG MT LOZG: 1.0000 | LOZENGE | OROMUCOSAL | Status: DC | PRN

## 2022-08-27 MED ORDER — PHENYLEPHRINE HCL-NACL 20-0.9 MG/250ML-% IV SOLN
INTRAVENOUS | Status: DC | PRN
  Administered 2022-08-27: 60 ug/min via INTRAVENOUS

## 2022-08-27 MED ORDER — SIMETHICONE 80 MG PO CHEW
80.0000 mg | CHEWABLE_TABLET | Freq: Three times a day (TID) | ORAL | Status: DC
  Administered 2022-08-27 – 2022-08-31 (×12): 80 mg via ORAL
  Filled 2022-08-27 (×12): qty 1

## 2022-08-27 MED ORDER — OXYTOCIN-SODIUM CHLORIDE 30-0.9 UT/500ML-% IV SOLN
INTRAVENOUS | Status: DC | PRN
  Administered 2022-08-27: 30 [IU] via INTRAVENOUS

## 2022-08-27 MED ORDER — DEXTROSE 5 % IV SOLN
INTRAVENOUS | Status: DC | PRN
  Administered 2022-08-27: 3 g via INTRAVENOUS

## 2022-08-27 MED ORDER — OXYTOCIN-SODIUM CHLORIDE 30-0.9 UT/500ML-% IV SOLN
INTRAVENOUS | Status: AC
  Filled 2022-08-27: qty 500

## 2022-08-27 MED ORDER — ZOLPIDEM TARTRATE 5 MG PO TABS: 5.0000 mg | ORAL_TABLET | Freq: Every evening | ORAL | Status: DC | PRN

## 2022-08-27 MED ORDER — IBUPROFEN 600 MG PO TABS
600.0000 mg | ORAL_TABLET | Freq: Four times a day (QID) | ORAL | Status: AC
  Administered 2022-08-27 – 2022-08-30 (×12): 600 mg via ORAL
  Filled 2022-08-27 (×12): qty 1

## 2022-08-27 MED ORDER — MISOPROSTOL 25 MCG QUARTER TABLET
25.0000 ug | ORAL_TABLET | ORAL | Status: DC | PRN
  Administered 2022-08-27 (×2): 25 ug via ORAL
  Filled 2022-08-27 (×2): qty 1

## 2022-08-27 MED ORDER — OXYTOCIN-SODIUM CHLORIDE 30-0.9 UT/500ML-% IV SOLN: 2.5000 [IU]/h | INTRAVENOUS | Status: AC

## 2022-08-27 MED ORDER — DIBUCAINE (PERIANAL) 1 % EX OINT: 1.0000 | TOPICAL_OINTMENT | CUTANEOUS | Status: DC | PRN

## 2022-08-27 MED ORDER — DEXAMETHASONE SODIUM PHOSPHATE 10 MG/ML IJ SOLN
INTRAMUSCULAR | Status: AC
  Filled 2022-08-27: qty 1

## 2022-08-27 MED ORDER — DIPHENHYDRAMINE HCL 25 MG PO CAPS: 25.0000 mg | ORAL_CAPSULE | Freq: Four times a day (QID) | ORAL | Status: DC | PRN

## 2022-08-27 MED ORDER — WITCH HAZEL-GLYCERIN EX PADS: 1.0000 | MEDICATED_PAD | CUTANEOUS | Status: DC | PRN

## 2022-08-27 MED ORDER — TETANUS-DIPHTH-ACELL PERTUSSIS 5-2.5-18.5 LF-MCG/0.5 IM SUSY: 0.5000 mL | PREFILLED_SYRINGE | Freq: Once | INTRAMUSCULAR | Status: DC

## 2022-08-27 MED ORDER — COCONUT OIL OIL: 1.0000 | TOPICAL_OIL | Status: DC | PRN

## 2022-08-27 MED ORDER — DEXAMETHASONE SODIUM PHOSPHATE 10 MG/ML IJ SOLN
INTRAMUSCULAR | Status: DC | PRN
  Administered 2022-08-27: 10 mg via INTRAVENOUS

## 2022-08-27 MED ORDER — BUPIVACAINE IN DEXTROSE 0.75-8.25 % IT SOLN
INTRATHECAL | Status: DC | PRN
  Administered 2022-08-27: 1.6 mL via INTRATHECAL

## 2022-08-27 MED ORDER — FENTANYL CITRATE (PF) 100 MCG/2ML IJ SOLN
INTRAMUSCULAR | Status: DC | PRN
  Administered 2022-08-27: 15 ug via INTRATHECAL

## 2022-08-27 MED ORDER — SENNOSIDES-DOCUSATE SODIUM 8.6-50 MG PO TABS
2.0000 | ORAL_TABLET | Freq: Every day | ORAL | Status: DC
  Administered 2022-08-28 – 2022-08-31 (×4): 2 via ORAL
  Filled 2022-08-27 (×4): qty 2

## 2022-08-27 MED ORDER — ONDANSETRON HCL 4 MG/2ML IJ SOLN
INTRAMUSCULAR | Status: DC | PRN
  Administered 2022-08-27: 4 mg via INTRAVENOUS

## 2022-08-27 MED ORDER — PHENYLEPHRINE 80 MCG/ML (10ML) SYRINGE FOR IV PUSH (FOR BLOOD PRESSURE SUPPORT)
PREFILLED_SYRINGE | INTRAVENOUS | Status: AC
  Filled 2022-08-27: qty 10

## 2022-08-27 MED ORDER — PRENATAL MULTIVITAMIN CH
1.0000 | ORAL_TABLET | Freq: Every day | ORAL | Status: DC
  Administered 2022-08-27 – 2022-08-31 (×5): 1 via ORAL
  Filled 2022-08-27 (×6): qty 1

## 2022-08-27 MED ORDER — SODIUM CHLORIDE 0.9 % IR SOLN
Status: DC | PRN
  Administered 2022-08-27: 1000 mL

## 2022-08-27 MED ORDER — RHO D IMMUNE GLOBULIN 1500 UNIT/2ML IJ SOSY
300.0000 ug | PREFILLED_SYRINGE | Freq: Once | INTRAMUSCULAR | Status: AC
  Administered 2022-08-28: 300 ug via INTRAVENOUS
  Filled 2022-08-27: qty 2

## 2022-08-27 MED ORDER — OXYCODONE HCL 5 MG PO TABS: 5.0000 mg | ORAL_TABLET | ORAL | Status: DC | PRN

## 2022-08-27 MED ORDER — FENTANYL CITRATE (PF) 100 MCG/2ML IJ SOLN: 25.0000 ug | INTRAMUSCULAR | Status: DC | PRN

## 2022-08-27 MED ORDER — ACETAMINOPHEN 325 MG PO TABS: 650.0000 mg | ORAL_TABLET | ORAL | Status: DC | PRN

## 2022-08-27 MED ORDER — MORPHINE SULFATE (PF) 0.5 MG/ML IJ SOLN
INTRAMUSCULAR | Status: AC
  Filled 2022-08-27: qty 10

## 2022-08-27 MED ORDER — SIMETHICONE 80 MG PO CHEW: 80.0000 mg | CHEWABLE_TABLET | ORAL | Status: DC | PRN

## 2022-08-27 MED ORDER — STERILE WATER FOR IRRIGATION IR SOLN
Status: DC | PRN
  Administered 2022-08-27: 1000 mL

## 2022-08-27 MED ORDER — PHENYLEPHRINE HCL-NACL 20-0.9 MG/250ML-% IV SOLN
INTRAVENOUS | Status: AC
  Filled 2022-08-27: qty 250

## 2022-08-27 MED ORDER — LACTATED RINGERS IV SOLN: INTRAVENOUS | Status: DC

## 2022-08-27 MED ORDER — LABETALOL HCL 200 MG PO TABS
300.0000 mg | ORAL_TABLET | Freq: Two times a day (BID) | ORAL | Status: DC
  Administered 2022-08-27 – 2022-08-31 (×9): 300 mg via ORAL
  Filled 2022-08-27 (×9): qty 1

## 2022-08-27 SURGICAL SUPPLY — 37 items
BARRIER ADHS 3X4 INTERCEED (GAUZE/BANDAGES/DRESSINGS) IMPLANT
BENZOIN TINCTURE PRP APPL 2/3 (GAUZE/BANDAGES/DRESSINGS) IMPLANT
CANISTER PREVENA PLUS 150 (CANNISTER) IMPLANT
CHLORAPREP W/TINT 26ML (MISCELLANEOUS) ×2 IMPLANT
CLAMP UMBILICAL CORD (MISCELLANEOUS) ×1 IMPLANT
CLOTH BEACON ORANGE TIMEOUT ST (SAFETY) ×1 IMPLANT
DRESSING PREVENA PLUS CUSTOM (GAUZE/BANDAGES/DRESSINGS) IMPLANT
DRSG OPSITE POSTOP 4X10 (GAUZE/BANDAGES/DRESSINGS) ×1 IMPLANT
DRSG PREVENA PLUS CUSTOM (GAUZE/BANDAGES/DRESSINGS) ×2
ELECT REM PT RETURN 9FT ADLT (ELECTROSURGICAL) ×1
ELECTRODE REM PT RTRN 9FT ADLT (ELECTROSURGICAL) ×1 IMPLANT
EXTRACTOR VACUUM M CUP 4 TUBE (SUCTIONS) IMPLANT
GLOVE BIO SURGEON STRL SZ 6.5 (GLOVE) ×1 IMPLANT
GLOVE BIOGEL PI IND STRL 7.0 (GLOVE) ×1 IMPLANT
GOWN STRL REUS W/TWL LRG LVL3 (GOWN DISPOSABLE) ×2 IMPLANT
KIT ABG SYR 3ML LUER SLIP (SYRINGE) IMPLANT
MAT PREVALON FULL STRYKER (MISCELLANEOUS) IMPLANT
NDL HYPO 25X5/8 SAFETYGLIDE (NEEDLE) ×1 IMPLANT
NEEDLE HYPO 22GX1.5 SAFETY (NEEDLE) IMPLANT
NEEDLE HYPO 25X5/8 SAFETYGLIDE (NEEDLE) ×1 IMPLANT
NS IRRIG 1000ML POUR BTL (IV SOLUTION) ×1 IMPLANT
PACK C SECTION WH (CUSTOM PROCEDURE TRAY) ×1 IMPLANT
PAD OB MATERNITY 4.3X12.25 (PERSONAL CARE ITEMS) ×1 IMPLANT
RETRACTOR TRAXI PANNICULUS (MISCELLANEOUS) IMPLANT
STRIP CLOSURE SKIN 1/2X4 (GAUZE/BANDAGES/DRESSINGS) IMPLANT
SUT CHROMIC 0 CTX 36 (SUTURE) ×2 IMPLANT
SUT PLAIN 0 NONE (SUTURE) IMPLANT
SUT PLAIN 2 0 XLH (SUTURE) IMPLANT
SUT VIC AB 0 CT1 27 (SUTURE) ×3
SUT VIC AB 0 CT1 27XBRD ANBCTR (SUTURE) ×3 IMPLANT
SUT VIC AB 4-0 KS 27 (SUTURE) IMPLANT
SYR CONTROL 10ML LL (SYRINGE) IMPLANT
TOWEL OR 17X24 6PK STRL BLUE (TOWEL DISPOSABLE) ×1 IMPLANT
TRAXI PANNICULUS RETRACTOR (MISCELLANEOUS) ×1
TRAY FOLEY W/BAG SLVR 14FR LF (SET/KITS/TRAYS/PACK) ×1 IMPLANT
VACUUM CUP M-STYLE MYSTIC II (SUCTIONS) IMPLANT
WATER STERILE IRR 1000ML POUR (IV SOLUTION) ×1 IMPLANT

## 2022-08-27 NOTE — Op Note (Signed)
Jody Griffin, BOATNER MEDICAL RECORD NO: [id number] ACCOUNT NO: [id number] DATE OF BIRTH: 1986/09/19 FACILITY: [facility] LOCATION: [facility] PHYSICIAN: Shaquan Missey L. Vincente Poli, MD  Operative Report   DATE OF PROCEDURE: 08/27/2022  PREOPERATIVE DIAGNOSES:  Intrauterine pregnancy at 35 weeks and 3 days, severe preeclampsia in breech.  POSTOPERATIVE DIAGNOSIS:  Intrauterine pregnancy at 35 weeks and 3 days, severe preeclampsia in breech.  PROCEDURE:  Primary low transverse cesarean section.  SURGEON:  Zein Helbing L. Vincente Poli, MD.  ASSISTANT:  None.  ANESTHESIA:  Spinal.  ESTIMATED BLOOD LOSS:  288 mL  COMPLICATIONS:  None.  DRAINS:  Foley.   SPECIMEN:  None.  COUNTS:  Correct.  COMPLICATIONS:  None.  DESCRIPTION OF PROCEDURE:  The patient was taken to the operating room.  She was then prepped and draped in the usual sterile fashion.  Timeout was performed.  A low transverse incision was made, carried down to the fascia.  The fascia was scored in the  midline and extended laterally.  She has significant subcutaneous and it was decided to do a Prevena postoperative wound VAC postop.  The fascia was scored in the midline and extended laterally.  The rectus muscles were separated and the peritoneum was  divided in the midline.  The peritoneal incision was then stretched by myself and then a bladder blade was inserted.  A low transverse incision was made in the uterus.  Uterus was entered using a hemostat.  The amniotic fluid was clear.  The baby was in  frank breech presentation who was delivered easily.  The baby was a vigorous female, Apgars 8 at 1 minute and 9 at 5 minutes.  The cord was clamped and cut and handed to the neonatal team.  The placenta was manually removed, noted to be normal intact  with a 3-vessel cord.  The uterus was exteriorized, cleared of all clots and debris, it firmed up nicely.  We closed in one layer using 0 chromic.  Hemostasis was very good.  We returned to the abdomen.   Irrigation was performed.  Hemostasis was good.   The peritoneum was closed using 0 Vicryl.  The fascia was closed using 0 Vicryl starting in each corner meeting in the midline.  We then closed the subcutaneous using plain gut interrupted.  The skin was closed with 3-0 Vicryl.  We then placed a wound  VAC on.  All sponge, lap and instrument counts were correct x2.  The patient went to recovery room in stable condition.   PUS D: 08/27/2022 11:55:18 am T: 08/27/2022 7:33:00 pm  JOB: [id number]/ [id number]

## 2022-08-27 NOTE — Anesthesia Postprocedure Evaluation (Signed)
Anesthesia Post Note  Patient: Jody Griffin  Procedure(s) Performed: CESAREAN SECTION     Patient location during evaluation: PACU Anesthesia Type: Spinal Level of consciousness: awake and alert Pain management: pain level controlled Vital Signs Assessment: post-procedure vital signs reviewed and stable Respiratory status: spontaneous breathing and respiratory function stable Cardiovascular status: blood pressure returned to baseline and stable Postop Assessment: spinal receding Anesthetic complications: no   No notable events documented.  Last Vitals:  Vitals:   08/27/22 1413 08/27/22 1501  BP: (!) 141/90 (!) 153/95  Pulse: (!) 57 63  Resp: 16   Temp: (!) 36.3 C   SpO2: 98%     Last Pain:  Vitals:   08/27/22 1413  TempSrc: Oral  PainSc: 0-No pain   Pain Goal: Patients Stated Pain Goal: 0 (08/27/22 9242)                 Tiajuana Amass

## 2022-08-27 NOTE — Anesthesia Preprocedure Evaluation (Addendum)
Anesthesia Evaluation  Patient identified by MRN, date of birth, ID band Patient awake    Reviewed: Allergy & Precautions, NPO status , Patient's Chart, lab work & pertinent test results  Airway Mallampati: III  TM Distance: >3 FB Neck ROM: Full    Dental  (+) Dental Advisory Given   Pulmonary neg pulmonary ROS,    breath sounds clear to auscultation       Cardiovascular hypertension (Pre-E with severe features), Pt. on home beta blockers  Rhythm:Regular Rate:Normal     Neuro/Psych negative neurological ROS     GI/Hepatic negative GI ROS, Neg liver ROS,   Endo/Other  negative endocrine ROS  Renal/GU negative Renal ROS     Musculoskeletal   Abdominal   Peds  Hematology negative hematology ROS (+)   Anesthesia Other Findings   Reproductive/Obstetrics (+) Pregnancy                             Lab Results  Component Value Date   WBC 15.3 (H) 08/27/2022   HGB 14.5 08/27/2022   HCT 42.0 08/27/2022   MCV 89.4 08/27/2022   PLT 164 08/27/2022   Lab Results  Component Value Date   CREATININE 1.03 (H) 08/26/2022   BUN 16 08/26/2022   NA 135 08/26/2022   K 3.9 08/26/2022   CL 105 08/26/2022   CO2 20 (L) 08/26/2022    Anesthesia Physical Anesthesia Plan  ASA: 3  Anesthesia Plan: Spinal   Post-op Pain Management:    Induction:   PONV Risk Score and Plan: 2 and Dexamethasone, Ondansetron and Treatment may vary due to age or medical condition  Airway Management Planned: Natural Airway  Additional Equipment:   Intra-op Plan:   Post-operative Plan:   Informed Consent: I have reviewed the patients History and Physical, chart, labs and discussed the procedure including the risks, benefits and alternatives for the proposed anesthesia with the patient or authorized representative who has indicated his/her understanding and acceptance.       Plan Discussed with:   Anesthesia  Plan Comments:         Anesthesia Quick Evaluation

## 2022-08-27 NOTE — Transfer of Care (Signed)
Immediate Anesthesia Transfer of Care Note  Patient: Jody Griffin  Procedure(s) Performed: CESAREAN SECTION  Patient Location: PACU  Anesthesia Type:Spinal  Level of Consciousness: awake, alert  and oriented  Airway & Oxygen Therapy: Patient Spontanous Breathing  Post-op Assessment: Report given to RN and Post -op Vital signs reviewed and stable  Post vital signs: Reviewed and stable  Last Vitals:  Vitals Value Taken Time  BP 117/76 08/27/22 1218  Temp    Pulse 82 08/27/22 1220  Resp 16 08/27/22 1220  SpO2 96 % 08/27/22 1220  Vitals shown include unvalidated device data.  Last Pain:  Vitals:   08/27/22 1041  TempSrc: Oral  PainSc:       Patients Stated Pain Goal: 0 (99/24/26 8341)  Complications: No notable events documented.

## 2022-08-27 NOTE — Progress Notes (Signed)
Since  Pitocin started  Cervix no change but repetitive variables with late component  Recommend Proceed with Primary LTCS Risks reviewed  OR notified

## 2022-08-27 NOTE — Anesthesia Procedure Notes (Signed)
Spinal  Patient location during procedure: OR Start time: 08/27/2022 11:00 AM End time: 08/27/2022 11:06 AM Reason for block: surgical anesthesia Staffing Anesthesiologist: Suzette Battiest, MD Performed by: Suzette Battiest, MD Authorized by: Suzette Battiest, MD   Preanesthetic Checklist Completed: patient identified, IV checked, site marked, risks and benefits discussed, surgical consent, monitors and equipment checked, pre-op evaluation and timeout performed Spinal Block Patient position: sitting Prep: DuraPrep Patient monitoring: heart rate, cardiac monitor, continuous pulse ox and blood pressure Approach: midline Location: L4-5 Injection technique: single-shot Needle Needle type: Pencan  Needle gauge: 24 G Needle length: 9 cm Assessment Sensory level: T4 Events: CSF return

## 2022-08-27 NOTE — Progress Notes (Signed)
Exam  Breech  Confirmed with ultrasound  Proceed with C Section OR notified  Repeat CBC  OR scheduled for 0930

## 2022-08-27 NOTE — Brief Op Note (Signed)
08/26/2022 - 08/27/2022  11:51 AM  PATIENT:  Jody Griffin  36 y.o. female  PRE-OPERATIVE DIAGNOSIS:  IUP at 34 w 3 days Severe Preeclampsia BREECH  POST-OPERATIVE DIAGNOSIS:   Same  PROCEDURE:  Procedure(s): CESAREAN SECTION (N/A)  SURGEON:  Surgeon(s) and Role:    * Dian Queen, MD - Primary  PHYSICIAN ASSISTANT:   ASSISTANTS: none   ANESTHESIA:   spinal  EBL:  288 mL   BLOOD ADMINISTERED:none  DRAINS: Urinary Catheter (Foley)   LOCAL MEDICATIONS USED:  NONE  SPECIMEN:  No Specimen  DISPOSITION OF SPECIMEN:  N/A  COUNTS:  YES  TOURNIQUET:  * No tourniquets in log *  DICTATION: .Other Dictation: Dictation Number dictated  PLAN OF CARE: Admit to inpatient   PATIENT DISPOSITION:  PACU - hemodynamically stable.   Delay start of Pharmacological VTE agent (>24hrs) due to surgical blood loss or risk of bleeding: not applicable

## 2022-08-27 NOTE — Lactation Note (Signed)
This note was copied from a baby's chart. Lactation Consultation Note  Patient Name: Jody Griffin BSWHQ'P Date: 08/27/2022 Reason for consult: Initial assessment;Primapara;Late-preterm 34-36.6wks;Infant < 6lbs Age:35 hours Baby has had low tempts. Are OK at this time. LC put on 2 t-shirts for top and bottom and swaddled in 2 blankets, has on hat. Baby wouldn't do anything at the breast. Was STS and covered at the breast.  Gave DBM after swaddled. Took well Reviewed if not BF then double the supplement. Mom has pumped 1 time. Suggested pumping before latching for now to "prime" the breast and evert nipples more. Mom pumped. #21 flanges used. No collection at this time. Praised parents for their hard work. Left for bonding time. LPI behavior, STS, importance of I&O, supply and demand reviewed. Encouraged to call for assistance as needed.  Maternal Data Has patient been taught Hand Expression?: Yes Does the patient have breastfeeding experience prior to this delivery?: No  Feeding Mother's Current Feeding Choice: Breast Milk and Donor Milk Nipple Type: Nfant Slow Flow (purple)  LATCH Score Latch: Too sleepy or reluctant, no latch achieved, no sucking elicited.  Audible Swallowing: None  Type of Nipple: Everted at rest and after stimulation (short shaft)  Comfort (Breast/Nipple): Filling, red/small blisters or bruises, mild/mod discomfort (edema)  Hold (Positioning): Full assist, staff holds infant at breast  LATCH Score: 3   Lactation Tools Discussed/Used Tools: Pump;Flanges Flange Size: 21 Breast pump type: Double-Electric Breast Pump Pump Education: Setup, frequency, and cleaning;Milk Storage Reason for Pumping: LPI Pumping frequency: q3h Pumped volume: 0 mL  Interventions Interventions: Breast feeding basics reviewed;Adjust position;DEBP;Assisted with latch;Support pillows;Skin to skin;Position options;Breast massage;Pace feeding;Hand express;LC Services  brochure;LPT handout/interventions;Pre-pump if needed;Reverse pressure;Breast compression  Discharge    Consult Status Consult Status: Follow-up Date: 08/28/22 Follow-up type: In-patient    Theodoro Kalata 08/27/2022, 11:01 PM

## 2022-08-28 LAB — CBC
HCT: 37.2 % (ref 36.0–46.0)
Hemoglobin: 12.2 g/dL (ref 12.0–15.0)
MCH: 30.4 pg (ref 26.0–34.0)
MCHC: 32.8 g/dL (ref 30.0–36.0)
MCV: 92.8 fL (ref 80.0–100.0)
Platelets: 154 10*3/uL (ref 150–400)
RBC: 4.01 MIL/uL (ref 3.87–5.11)
RDW: 13.9 % (ref 11.5–15.5)
WBC: 21.4 10*3/uL — ABNORMAL HIGH (ref 4.0–10.5)
nRBC: 0 % (ref 0.0–0.2)

## 2022-08-28 LAB — COMPREHENSIVE METABOLIC PANEL
ALT: 62 U/L — ABNORMAL HIGH (ref 0–44)
AST: 57 U/L — ABNORMAL HIGH (ref 15–41)
Albumin: 2.5 g/dL — ABNORMAL LOW (ref 3.5–5.0)
Alkaline Phosphatase: 87 U/L (ref 38–126)
Anion gap: 6 (ref 5–15)
BUN: 14 mg/dL (ref 6–20)
CO2: 22 mmol/L (ref 22–32)
Calcium: 8.3 mg/dL — ABNORMAL LOW (ref 8.9–10.3)
Chloride: 106 mmol/L (ref 98–111)
Creatinine, Ser: 1.07 mg/dL — ABNORMAL HIGH (ref 0.44–1.00)
GFR, Estimated: >60 mL/min (ref >60)
Glucose, Bld: 113 mg/dL — ABNORMAL HIGH (ref 70–99)
Potassium: 4.9 mmol/L (ref 3.5–5.1)
Sodium: 134 mmol/L — ABNORMAL LOW (ref 135–145)
Total Bilirubin: 0.5 mg/dL (ref 0.3–1.2)
Total Protein: 5.6 g/dL — ABNORMAL LOW (ref 6.5–8.1)

## 2022-08-28 NOTE — Lactation Note (Signed)
This note was copied from a baby's chart. Lactation Consultation Note  Patient Name: Jody Griffin [id number] Date: 08/28/2022 Reason for consult: Follow-up assessment;Difficult latch;Primapara;1st time breastfeeding;Late-preterm 34-36.6wks;Infant weight loss;Breastfeeding assistance;Infant < 6lbs (1.18% WL) Age:35 hours  P1, Late Pre-term, Infant Female, 1.18% WL  LC entered the room and the infant was STS with the supporting parent.  Per the birth parent, she has not been doing a lot of pumping because she just got off Mag 2 hours ago.  She stated that she has been trying to put baby to the breast, but she was told not to let her stay too Karis due to her size.  The birth parent said that the infant has been supplemented with donor breast milk.  The birth parent asked if the [facility] could demonstrate how to hand express.  LC used the teach back method and the birth parent was able to demonstrate an understanding.  No drops were noted.  LC spoke with the birth parent about supply and demand, milk storage guidelines,and reviewed pumping frequency. The birth parent did not express any concerns and had no further questions.  The birth parent will call RN/LC for assistance with breastfeeding.  Maternal Data Has patient been taught Hand Expression?: Yes  Feeding Nipple Type: Nfant Slow Flow (purple)  LATCH Score                    Lactation Tools Discussed/Used    Interventions Interventions: Breast feeding basics reviewed;Hand express;Breast massage;Education  Discharge    Consult Status Consult Status: Follow-up Date: 08/29/22 Follow-up type: In-patient    Lysbeth Penner 08/28/2022, 1:52 PM

## 2022-08-28 NOTE — Lactation Note (Signed)
This note was copied from a baby's chart. Lactation Consultation Note  Patient Name: Jody Griffin CVELF'Y Date: 08/28/2022   Age:35 hours  LC attempted to visit with the birth parent and infant. The supporting parent stated that the grandparents were visiting and asked if [place] could come back at 1215.   Maternal Data    Feeding Nipple Type: Nfant Slow Flow (purple)  LATCH Score                    Lactation Tools Discussed/Used    Interventions    Discharge    Consult Status      Ilyssa Grennan P Corian Handley 08/28/2022, 11:22 AM

## 2022-08-28 NOTE — Progress Notes (Signed)
POD # 1  Doing well no headache. BP 130/81 (BP Location: Right Arm)   Pulse 72   Temp 97.8 F (36.6 C) (Oral)   Resp 18   Ht 5\' 11"  (1.803 m)   Wt 134.6 kg   SpO2 94%   Breastfeeding Unknown   BMI 41.40 kg/m  Results for orders placed or performed during the hospital encounter of 08/26/22 (from the past 24 hour(s))  CBC     Status: Abnormal   Collection Time: 08/28/22  4:20 AM  Result Value Ref Range   WBC 21.4 (H) 4.0 - 10.5 K/uL   RBC 4.01 3.87 - 5.11 MIL/uL   Hemoglobin 12.2 12.0 - 15.0 g/dL   HCT 37.2 36.0 - 46.0 %   MCV 92.8 80.0 - 100.0 fL   MCH 30.4 26.0 - 34.0 pg   MCHC 32.8 30.0 - 36.0 g/dL   RDW 13.9 11.5 - 15.5 %   Platelets 154 150 - 400 K/uL   nRBC 0.0 0.0 - 0.2 %  Rh IG workup (includes ABO/Rh)     Status: None (Preliminary result)   Collection Time: 08/28/22  4:20 AM  Result Value Ref Range   Gestational Age(Wks) 35    Fetal Screen      NEG Performed at [facility], [address], [place],  [address]    Unit Number [id number]    Blood Component Type RHIG    Unit division 00    Status of Unit ALLOCATED    Transfusion Status OK TO TRANSFUSE   Comprehensive metabolic panel     Status: Abnormal   Collection Time: 08/28/22  5:56 AM  Result Value Ref Range   Sodium 134 (L) 135 - 145 mmol/L   Potassium 4.9 3.5 - 5.1 mmol/L   Chloride 106 98 - 111 mmol/L   CO2 22 22 - 32 mmol/L   Glucose, Bld 113 (H) 70 - 99 mg/dL   BUN 14 6 - 20 mg/dL   Creatinine, Ser 1.07 (H) 0.44 - 1.00 mg/dL   Calcium 8.3 (L) 8.9 - 10.3 mg/dL   Total Protein 5.6 (L) 6.5 - 8.1 g/dL   Albumin 2.5 (L) 3.5 - 5.0 g/dL   AST 57 (H) 15 - 41 U/L   ALT 62 (H) 0 - 44 U/L   Alkaline Phosphatase 87 38 - 126 U/L   Total Bilirubin 0.5 0.3 - 1.2 mg/dL   GFR, Estimated >60 >60 mL/min   Anion gap 6 5 - 15   Wound vac in place   POD # 1  Doing well Routine care Severe preeclampsia - still elevated creatinine and LFTS have bumped up which I am not surprised - recheck  tomorrow and follow  Discontinue Magnesium because BP is good and diuresing very well Plan of care reviewed at bedside

## 2022-08-29 LAB — COMPREHENSIVE METABOLIC PANEL
ALT: 62 U/L — ABNORMAL HIGH (ref 0–44)
AST: 43 U/L — ABNORMAL HIGH (ref 15–41)
Albumin: 2.4 g/dL — ABNORMAL LOW (ref 3.5–5.0)
Alkaline Phosphatase: 81 U/L (ref 38–126)
Anion gap: 7 (ref 5–15)
BUN: 18 mg/dL (ref 6–20)
CO2: 23 mmol/L (ref 22–32)
Calcium: 8.6 mg/dL — ABNORMAL LOW (ref 8.9–10.3)
Chloride: 109 mmol/L (ref 98–111)
Creatinine, Ser: 1.09 mg/dL — ABNORMAL HIGH (ref 0.44–1.00)
GFR, Estimated: >60 mL/min (ref >60)
Glucose, Bld: 97 mg/dL (ref 70–99)
Potassium: 4.2 mmol/L (ref 3.5–5.1)
Sodium: 139 mmol/L (ref 135–145)
Total Bilirubin: 0.3 mg/dL (ref 0.3–1.2)
Total Protein: 5.5 g/dL — ABNORMAL LOW (ref 6.5–8.1)

## 2022-08-29 LAB — RH IG WORKUP (INCLUDES ABO/RH)
Fetal Screen: NEGATIVE
Gestational Age(Wks): 35
Unit division: 0

## 2022-08-29 LAB — CBC
HCT: 35.2 % — ABNORMAL LOW (ref 36.0–46.0)
Hemoglobin: 11.5 g/dL — ABNORMAL LOW (ref 12.0–15.0)
MCH: 31.3 pg (ref 26.0–34.0)
MCHC: 32.7 g/dL (ref 30.0–36.0)
MCV: 95.7 fL (ref 80.0–100.0)
Platelets: 117 10*3/uL — ABNORMAL LOW (ref 150–400)
RBC: 3.68 MIL/uL — ABNORMAL LOW (ref 3.87–5.11)
RDW: 14.3 % (ref 11.5–15.5)
WBC: 14.8 10*3/uL — ABNORMAL HIGH (ref 4.0–10.5)
nRBC: 0 % (ref 0.0–0.2)

## 2022-08-29 NOTE — Lactation Note (Addendum)
This note was copied from a baby's chart. Lactation Consultation Note  Patient Name: Girl Madilyn Cephas HQPRF'F Date: 08/29/2022 Reason for consult: Follow-up assessment;1st time breastfeeding;Late-preterm 34-36.6wks;Infant < 6lbs;Infant weight loss;Primapara Age:35 hours  P1, [redacted]w[redacted]d PMA.  6.18% weight loss.  5% in the last 24 hours. Mother is pumping approx 5 ml and supplementing with fortified donor milk.  Total volume baby has been taking 8-14 ml. Suggest to parents to stabilize weight loss to increase volume to 15-17 ml per LPI new guidelines per birth weight. If baby would like more volume, suggest increasing in 5 ml increments, as baby desires.  Baby is using extra slow flow nipple.  Observed feeding.  FOB holding baby away from his body.  Suggest a slightly upright position and paced feeding.   Baby has not been latching but spoke with mother to allow baby to nuzzle at breast a few times per day. Mother is pumping q 3 hours with 21 flanges.   Feeding Mother's Current Feeding Choice: Breast Milk and Donor Milk Nipple Type: Extra Slow Flow   Lactation Tools Discussed/Used Tools: Pump Flange Size: 21 Breast pump type: Double-Electric Breast Pump Reason for Pumping: stimulation and supplementation Pumping frequency: q 3 h Pumped volume: 5 mL  Interventions Interventions: DEBP;Education;Pace feeding  Discharge Pump: Personal;DEBP (Spectra)  Consult Status Consult Status: Follow-up Date: 08/30/22 Follow-up type: In-patient    Vivianne Master [facility] 08/29/2022, 12:54 PM

## 2022-08-29 NOTE — Progress Notes (Signed)
POD # 2  Doing well. No headache. BP (!) 142/90 (BP Location: Right Arm)   Pulse 75   Temp 97.7 F (36.5 C) (Oral)   Resp 16   Ht 5\' 11"  (1.803 m)   Wt 134.6 kg   SpO2 96%   Breastfeeding Unknown   BMI 41.40 kg/m  Results for orders placed or performed during the hospital encounter of 08/26/22 (from the past 24 hour(s))  CBC     Status: Abnormal   Collection Time: 08/29/22  5:00 AM  Result Value Ref Range   WBC 14.8 (H) 4.0 - 10.5 K/uL   RBC 3.68 (L) 3.87 - 5.11 MIL/uL   Hemoglobin 11.5 (L) 12.0 - 15.0 g/dL   HCT 35.2 (L) 36.0 - 46.0 %   MCV 95.7 80.0 - 100.0 fL   MCH 31.3 26.0 - 34.0 pg   MCHC 32.7 30.0 - 36.0 g/dL   RDW 14.3 11.5 - 15.5 %   Platelets 117 (L) 150 - 400 K/uL   nRBC 0.0 0.0 - 0.2 %  Comprehensive metabolic panel     Status: Abnormal   Collection Time: 08/29/22  5:00 AM  Result Value Ref Range   Sodium 139 135 - 145 mmol/L   Potassium 4.2 3.5 - 5.1 mmol/L   Chloride 109 98 - 111 mmol/L   CO2 23 22 - 32 mmol/L   Glucose, Bld 97 70 - 99 mg/dL   BUN 18 6 - 20 mg/dL   Creatinine, Ser 1.09 (H) 0.44 - 1.00 mg/dL   Calcium 8.6 (L) 8.9 - 10.3 mg/dL   Total Protein 5.5 (L) 6.5 - 8.1 g/dL   Albumin 2.4 (L) 3.5 - 5.0 g/dL   AST 43 (H) 15 - 41 U/L   ALT 62 (H) 0 - 44 U/L   Alkaline Phosphatase 81 38 - 126 U/L   Total Bilirubin 0.3 0.3 - 1.2 mg/dL   GFR, Estimated >60 >60 mL/min   Anion gap 7 5 - 15   Abdomen is soft and non tender  Wound vac in place   POD # 2  HELLP Syndrome  Wound Vac - C Section  Patient doing better  Recheck labs in am / not on BP meds currently  Consider discharge home tomorrow and remove Wound Vac on POD # 5

## 2022-08-29 NOTE — Progress Notes (Signed)
CSW received consult for Edinburgh score of 12.  CSW met with Jody Griffin to offer support and complete assessment. When CSW entered room, Jody Griffin was observed sitting in hospital bed. Infant was sleeping on back in bassinet, Jody Griffin's mother was present. CSW introduced self and requested to speak with Jody Griffin alone. Jody Griffin provided verbal consent to speak in front of her mother about anything. CSW introduced self and explained reason for consult.  CSW reviewed Jody Griffin's EPDS with Jody Griffin and inquired how Jody Griffin has felt emotionally since giving birth. Jody Griffin became tearful, sharing that yesterday was a rough day but she is feeling better today after getting sleep. Jody Griffin shared that having a c-section was unexpected due to infant being in breech position. Jody Griffin shares she has felt overwhelmed and out of control during her birthing experience. CSW provided active listening and validated Jody Griffin's emotions. Jody Griffin shared her feelings of anxiety are situational to infant's delivery and her postpartum recovery. Jody Griffin reports she has felt worried about infant's weight and size due to being born early but expressed hopefulness when discussing her pumping progress and was able to smile and laugh during conversation. CSW provided emotional support and normalized Jody Griffin's emotional experience.   CSW inquired if Jody Griffin has a history of mental health diagnoses. Jody Griffin denies a history of depression/anxiety. Jody Griffin denied current SI/HI/. Jody Griffin reports she feels supported by FOB and her mother.   Jody Griffin reports she has all needed items for infant, including a car seat and bassinet. Jody Griffin has chosen [facility] as infant's pediatrician office.  CSW provided education regarding the baby blues period vs. perinatal mood disorders, discussed treatment and gave resources for mental health follow up if concerns arise.  CSW recommends self-evaluation during the postpartum time period using the New Mom Checklist from Postpartum Progress and encouraged Jody Griffin to contact a medical professional  if symptoms are noted at any time.   CSW provided review of Sudden Infant Death Syndrome (SIDS) precautions.    CSW identifies no further need for intervention and no barriers to discharge at this time.  Signed,  Berniece Salines, MSW, LCSWA, LCASA 11-09-2022 6:54 PM

## 2022-08-30 LAB — COMPREHENSIVE METABOLIC PANEL
ALT: 62 U/L — ABNORMAL HIGH (ref 0–44)
AST: 55 U/L — ABNORMAL HIGH (ref 15–41)
Albumin: 2.5 g/dL — ABNORMAL LOW (ref 3.5–5.0)
Alkaline Phosphatase: 88 U/L (ref 38–126)
Anion gap: 8 (ref 5–15)
BUN: 16 mg/dL (ref 6–20)
CO2: 22 mmol/L (ref 22–32)
Calcium: 8.9 mg/dL (ref 8.9–10.3)
Chloride: 107 mmol/L (ref 98–111)
Creatinine, Ser: 0.99 mg/dL (ref 0.44–1.00)
GFR, Estimated: >60 mL/min (ref >60)
Glucose, Bld: 67 mg/dL — ABNORMAL LOW (ref 70–99)
Potassium: 4.9 mmol/L (ref 3.5–5.1)
Sodium: 137 mmol/L (ref 135–145)
Total Bilirubin: 0.9 mg/dL (ref 0.3–1.2)
Total Protein: 5.8 g/dL — ABNORMAL LOW (ref 6.5–8.1)

## 2022-08-30 LAB — CBC
HCT: 37.6 % (ref 36.0–46.0)
Hemoglobin: 12.2 g/dL (ref 12.0–15.0)
MCH: 31.1 pg (ref 26.0–34.0)
MCHC: 32.4 g/dL (ref 30.0–36.0)
MCV: 95.9 fL (ref 80.0–100.0)
Platelets: 97 10*3/uL — ABNORMAL LOW (ref 150–400)
RBC: 3.92 MIL/uL (ref 3.87–5.11)
RDW: 14.2 % (ref 11.5–15.5)
WBC: 13 10*3/uL — ABNORMAL HIGH (ref 4.0–10.5)
nRBC: 0 % (ref 0.0–0.2)

## 2022-08-30 MED ORDER — NIFEDIPINE ER OSMOTIC RELEASE 30 MG PO TB24
30.0000 mg | ORAL_TABLET | Freq: Every day | ORAL | Status: DC
  Administered 2022-08-30 – 2022-08-31 (×2): 30 mg via ORAL
  Filled 2022-08-30 (×2): qty 1

## 2022-08-30 NOTE — Progress Notes (Signed)
Subjective: Postpartum Day 3: Cesarean Delivery Patient reports tolerating PO, + flatus, and no problems voiding.    Objective: Vital signs in last 24 hours: Temp:  [97.6 F (36.4 C)-98.4 F (36.9 C)] 97.9 F (36.6 C) (09/25 0434) Pulse Rate:  [71-85] 71 (09/25 0434) Resp:  [14-17] 17 (09/25 0434) BP: (132-155)/(83-104) 138/91 (09/25 0434) SpO2:  [95 %-98 %] 98 % (09/25 0434)  Physical Exam:  General: alert, cooperative, and no distress Lochia: appropriate Uterine Fundus: firm Incision: healing well DVT Evaluation: No evidence of DVT seen on physical exam.  Recent Labs    08/29/22 0500 08/30/22 0425  HGB 11.5* 12.2  HCT 35.2* 37.6   Results for orders placed or performed during the hospital encounter of 08/26/22 (from the past 24 hour(s))  CBC     Status: Abnormal   Collection Time: 08/30/22  4:25 AM  Result Value Ref Range   WBC 13.0 (H) 4.0 - 10.5 K/uL   RBC 3.92 3.87 - 5.11 MIL/uL   Hemoglobin 12.2 12.0 - 15.0 g/dL   HCT 37.6 36.0 - 46.0 %   MCV 95.9 80.0 - 100.0 fL   MCH 31.1 26.0 - 34.0 pg   MCHC 32.4 30.0 - 36.0 g/dL   RDW 14.2 11.5 - 15.5 %   Platelets 97 (L) 150 - 400 K/uL   nRBC 0.0 0.0 - 0.2 %  Comprehensive metabolic panel     Status: Abnormal   Collection Time: 08/30/22  4:25 AM  Result Value Ref Range   Sodium 137 135 - 145 mmol/L   Potassium 4.9 3.5 - 5.1 mmol/L   Chloride 107 98 - 111 mmol/L   CO2 22 22 - 32 mmol/L   Glucose, Bld 67 (L) 70 - 99 mg/dL   BUN 16 6 - 20 mg/dL   Creatinine, Ser 0.99 0.44 - 1.00 mg/dL   Calcium 8.9 8.9 - 10.3 mg/dL   Total Protein 5.8 (L) 6.5 - 8.1 g/dL   Albumin 2.5 (L) 3.5 - 5.0 g/dL   AST 55 (H) 15 - 41 U/L   ALT 62 (H) 0 - 44 U/L   Alkaline Phosphatase 88 38 - 126 U/L   Total Bilirubin 0.9 0.3 - 1.2 mg/dL   GFR, Estimated >60 >60 mL/min   Anion gap 8 5 - 15     Assessment/Plan: Status post Cesarean section. Doing well postoperatively.  Continue current care. Gestational HTN-Continue labetalol 30mg  BID,  creatinine improved but LFT still mildly elevated and platelets now 97 K>observe and repeat labs in am [place], MD 08/30/2022, 8:54 AM

## 2022-08-30 NOTE — Lactation Note (Signed)
This note was copied from a baby's chart. Lactation Consultation Note  Patient Name: Jody Griffin [id number]'Y Date: 08/30/2022 Reason for consult: 1st time breastfeeding;Primapara;Late-preterm 34-36.6wks;Infant < 6lbs;Infant weight loss;Follow-up assessment;Nipple pain/trauma (7 % wt loss, LC spoke to  [facility] RN caring for the dyad/ recommended a speech consult to assess the nipple the baby is using eventhough per parents the purple nipple is working well. LC recommended to see if the change in nipple would Inc  volume per feed.) Age:38 hours LC praised birth parent for her pumping.  LC called [employer] ( Speech ) and she was with a another patient and will F/U with a call to [facility].   Maternal Data Has patient been taught Hand Expression?:  (LC recommended hand expressing before and after pumping)  Feeding Mother's Current Feeding Choice: Breast Milk and Donor Milk (per dad today the donor milk will be fortified.) Nipple Type: Nfant Slow Flow (purple)  LATCH Score - baby is just bottle feeding at present    Lactation Tools Discussed/Used Tools: Pump;Flanges Flange Size: 21 Breast pump type: Double-Electric Breast Pump;Manual (per mom using both for a break from using the DEBP) Pump Education: Setup, frequency, and cleaning Pumped volume: 17 mL  Interventions    Discharge Pump: DEBP;Personal  Consult Status Consult Status: Follow-up Date: 08/31/22 Follow-up type: In-patient    [place] 08/30/2022, 10:50 AM

## 2022-08-30 NOTE — Lactation Note (Addendum)
This note was copied from a baby's chart. Lactation Consultation Note  Patient Name: Jody Griffin [id number] Date: 08/30/2022 Reason for consult: Follow-up assessment Age:35 hours LC assessed /demo to parents pace feeding 6 ml mom had pumped off while the donor milk was warming up. Baby awake,and after she took 6 ml , was starting to get sleepy.  LC assisted dad to get ready to pace feed and still feeding ( 17 ml in the bottle to start).  [place] spoke with [facility] and her plan is to assess the feeding and check for the time of the next feeding.   Maternal Data Has patient been taught Hand Expression?:  (LC recommended hand expressing before and after pumping)  Feeding Mother's Current Feeding Choice: Breast Milk and Donor Milk Nipple Type: Extra Slow Flow    Lactation Tools Discussed/Used Tools: Pump;Flanges Flange Size: 21;Other (comment) (#21 F still a good fit - LC checked with pumping) Breast pump type: Double-Electric Breast Pump Pump Education: Setup, frequency, and cleaning Pumped volume: 17 mL  Interventions    Discharge Pump: DEBP;Personal  Consult Status Consult Status: Follow-up Date: 08/31/22 Follow-up type: In-patient    [place] 08/30/2022, 11:29 AM

## 2022-08-30 NOTE — Progress Notes (Signed)
No C/O except her best friend's father passed and she is upset with that  Today's Vitals   08/30/22 0957 08/30/22 1027 08/30/22 1212 08/30/22 1527  BP: (!) 156/90 (!) 156/91  (!) 153/93  Pulse: 71 83  77  Resp: 18   17  Temp:    98.3 F (36.8 C)  TempSrc:    Oral  SpO2: 100%   98%  Weight:      Height:      PainSc:   0-No pain    Body mass index is 41.4 kg/m.  Nifedipine XL 30mg  qd started Continue labetalol 300mg  BID

## 2022-08-31 LAB — COMPREHENSIVE METABOLIC PANEL
ALT: 73 U/L — ABNORMAL HIGH (ref 0–44)
AST: 53 U/L — ABNORMAL HIGH (ref 15–41)
Albumin: 2.6 g/dL — ABNORMAL LOW (ref 3.5–5.0)
Alkaline Phosphatase: 93 U/L (ref 38–126)
Anion gap: 4 — ABNORMAL LOW (ref 5–15)
BUN: 10 mg/dL (ref 6–20)
CO2: 23 mmol/L (ref 22–32)
Calcium: 8.8 mg/dL — ABNORMAL LOW (ref 8.9–10.3)
Chloride: 108 mmol/L (ref 98–111)
Creatinine, Ser: 0.9 mg/dL (ref 0.44–1.00)
GFR, Estimated: >60 mL/min (ref >60)
Glucose, Bld: 91 mg/dL (ref 70–99)
Potassium: 3.9 mmol/L (ref 3.5–5.1)
Sodium: 135 mmol/L (ref 135–145)
Total Bilirubin: 0.5 mg/dL (ref 0.3–1.2)
Total Protein: 6.1 g/dL — ABNORMAL LOW (ref 6.5–8.1)

## 2022-08-31 LAB — CBC
HCT: 38.1 % (ref 36.0–46.0)
Hemoglobin: 12.3 g/dL (ref 12.0–15.0)
MCH: 30.8 pg (ref 26.0–34.0)
MCHC: 32.3 g/dL (ref 30.0–36.0)
MCV: 95.5 fL (ref 80.0–100.0)
Platelets: 162 10*3/uL (ref 150–400)
RBC: 3.99 MIL/uL (ref 3.87–5.11)
RDW: 14.2 % (ref 11.5–15.5)
WBC: 13.6 10*3/uL — ABNORMAL HIGH (ref 4.0–10.5)
nRBC: 0 % (ref 0.0–0.2)

## 2022-08-31 MED ORDER — NIFEDIPINE ER 30 MG PO TB24: 30.0000 mg | ORAL_TABLET | Freq: Every day | ORAL | 1 refills | Status: AC

## 2022-08-31 MED ORDER — DOCUSATE SODIUM 100 MG PO CAPS: 100.0000 mg | ORAL_CAPSULE | Freq: Two times a day (BID) | ORAL | 2 refills | Status: AC

## 2022-08-31 MED ORDER — IBUPROFEN 600 MG PO TABS: 600.0000 mg | ORAL_TABLET | Freq: Four times a day (QID) | ORAL | 0 refills | Status: AC | PRN

## 2022-08-31 MED ORDER — BREAST MILK/FORMULA (FOR LABEL PRINTING ONLY): ORAL | Status: DC

## 2022-08-31 MED ORDER — OXYCODONE HCL 5 MG PO TABS: 5.0000 mg | ORAL_TABLET | ORAL | 0 refills | Status: AC | PRN

## 2022-08-31 NOTE — Lactation Note (Addendum)
This note was copied from a baby's chart. Lactation Consultation Note  Patient Name: Jody Griffin [id number] Date: 08/31/2022 Reason for consult: Follow-up assessment;Late-preterm 34-36.6wks;Infant < 6lbs Age:35 days  P1, [redacted]w[redacted]d GA.  Weight increased to 1911 grams. Baby taking volume of 13-24 in the last 24 hours.  Most feedings 20 ml +. Discussed with Jody Stabs RN this morning that mother's milk also needs to be fortified.   Mother pumping 70 ml. Mother's milk has since been brought to [facility] to be fortified. Last feeding baby took 24 ml with Dr. Saul Fordyce preemie.   Reviewed engorgement care and monitoring voids/stools. Recommend Lactation OP appt follow up as mother has a desire to breastfeed.  Time at the breast is being limited to conserve energy and allow to [name] recieve sufficient volume.   Maternal Data Has patient been taught Hand Expression?: Yes Does the patient have breastfeeding experience prior to this delivery?: No  Feeding Mother's Current Feeding Choice: Breast Milk and Donor Milk  Lactation Tools Discussed/Used Tools: Pump Flange Size: 21 Breast pump type: Double-Electric Breast Pump Reason for Pumping: stimulation and supplementation Pumping frequency: q 3 h Pumped volume: 70 mL  Interventions Interventions: DEBP;Education  Discharge Discharge Education: Engorgement and breast care;Warning signs for feeding baby;Outpatient recommendation Pump: DEBP;Personal (Spectra)  Consult Status Consult Status: Complete Date: 08/31/22    Vivianne Master [facility] 08/31/2022, 1:22 PM

## 2022-08-31 NOTE — Progress Notes (Signed)
Subjective: Postpartum Day 4: Cesarean Delivery Patient reports tolerating PO, + flatus, + BM, and no problems voiding.  Very much would like to go home today. Feels much better and denies s/s PIH.   Objective: Vital signs in last 24 hours: Temp:  [97.6 F (36.4 C)-98.6 F (37 C)] 97.6 F (36.4 C) (09/26 0758) Pulse Rate:  [71-91] 80 (09/26 0758) Resp:  [17-18] 17 (09/26 0758) BP: (135-156)/(84-93) 135/91 (09/26 0758) SpO2:  [98 %-100 %] 98 % (09/26 0758)  Physical Exam:  General: alert and cooperative Lochia: appropriate Uterine Fundus: firm Incision: healing well - wound vac in place DVT Evaluation: No evidence of DVT seen on physical exam.  Recent Labs    08/30/22 0425 08/31/22 0536  HGB 12.2 12.3  HCT 37.6 38.1    Assessment/Plan: Status post Cesarean section.  Nifedipine 30mg  XL Labetalol 300 mg BID Bps now mild range to normal Platelets normalized LFTs still elevated. However, do not anticipate normalization at this time.  Home later today with strict precautions.  Fu appt in office tomorrow for BP check and again on Friday for BP check and wound vac removal.    Tyson Dense, MD 08/31/2022, 7:37 AM

## 2022-08-31 NOTE — Final Progress Note (Signed)
Pt taught [facility] for  home use teaching for postpartum care complete

## 2022-08-31 NOTE — Discharge Summary (Signed)
Postpartum Discharge Summary     Patient Name: Jody Griffin DOB: May 04, 1986 MRN: [id number]  Date of admission: 08/26/2022 Delivery date:08/27/2022  Delivering provider: Dian Queen  Date of discharge: 08/31/2022  Admitting diagnosis: Preeclampsia [O14.90] Severe preeclampsia [O14.10] Intrauterine pregnancy: [redacted]w[redacted]d    Secondary diagnosis:  Principal Problem:   Preeclampsia Active Problems:   Severe preeclampsia  Additional problems: breech    Discharge diagnosis: Preterm Pregnancy Delivered and Preeclampsia (severe)                                              Post partum procedures: None Augmentation: N/A Complications: None  Hospital course: Sceduled C/S   36y.o. yo G1P0101 at 312w3das admitted to the hospital 08/26/2022 for scheduled cesarean section with the following indication:Malpresentation and severe PIH at 3582ga .Delivery details are as follows:  Membrane Rupture Time/Date: 11:28 AM ,08/27/2022   Delivery Method:C-Section, Low Transverse  Details of operation can be found in separate operative note.  She had magnesium until 24 hours pp. She had lab abnormalities pp - CRT and platelets normalized on day of discharge. LFTs remained elevated. Her BP meds were titrated to Labetalol 30038mID and Nifedipine 28m32matient had an uncomplicated postpartum course otherwise.  She is ambulating, tolerating a regular diet, passing flatus, and urinating well. Patient is discharged home in stable condition on  08/31/22        Newborn Data: Birth date:08/27/2022  Birth time:11:28 AM  Gender:Female  Living status:Living  Apgars:8 ,9  Weight:2040 g     Magnesium Sulfate received: Yes: Seizure prophylaxis BMZ received: Yes Rhophylac:Yes MMR:N/A T-DaP:Given prenatally Flu: N/A Transfusion:No  Physical exam  Vitals:   08/30/22 2114 08/31/22 0044 08/31/22 0550 08/31/22 0758  BP: (!) 147/84 (!) 146/92 (!) 145/87 (!) 135/91  Pulse: 87 91 82 80  Resp: _0 Temp:  98.6 F (37 C) 98.1 F (36.7 C) 98.5 F (36.9 C) 97.6 F (36.4 C)  TempSrc: Oral Oral Oral Oral  SpO2: 98% 99% 99% 98%  Weight:      Height:       General: alert Lochia: appropriate Uterine Fundus: firm Incision: Healing well with no significant drainage - wound vac in place DVT Evaluation: No evidence of DVT seen on physical exam. Labs: Lab Results  Component Value Date   WBC 13.6 (H) 08/31/2022   HGB 12.3 08/31/2022   HCT 38.1 08/31/2022   MCV 95.5 08/31/2022   PLT 162 08/31/2022      Latest Ref Rng & Units 08/31/2022    5:36 AM  CMP  Glucose 70 - 99 mg/dL 91   BUN 6 - 20 mg/dL 10   Creatinine 0.44 - 1.00 mg/dL 0.90   Sodium 135 - 145 mmol/L 135   Potassium 3.5 - 5.1 mmol/L 3.9   Chloride 98 - 111 mmol/L 108   CO2 22 - 32 mmol/L 23   Calcium 8.9 - 10.3 mg/dL 8.8   Total Protein 6.5 - 8.1 g/dL 6.1   Total Bilirubin 0.3 - 1.2 mg/dL 0.5   Alkaline Phos 38 - 126 U/L 93   AST 15 - 41 U/L 53   ALT 0 - 44 U/L 73    Edinburgh Score:    08/29/2022   11:00 AM  Edinburgh Postnatal Depression Scale Screening Tool  I have been able  to laugh and see the funny side of things. 1  I have looked forward with enjoyment to things. 1  I have blamed myself unnecessarily when things went wrong. 2  I have been anxious or worried for no good reason. 2  I have felt scared or panicky for no good reason. 1  Things have been getting on top of me. 2  I have been so unhappy that I have had difficulty sleeping. 1  I have felt sad or miserable. 1  I have been so unhappy that I have been crying. 1  The thought of harming myself has occurred to me. 0  Edinburgh Postnatal Depression Scale Total 12      After visit meds:  Allergies as of 08/31/2022   No Known Allergies      Medication List     TAKE these medications    docusate sodium 100 MG capsule Commonly known as: Colace Take 1 capsule (100 mg total) by mouth 2 (two) times daily.   ibuprofen 600 MG tablet Commonly known  as: ADVIL Take 1 tablet (600 mg total) by mouth every 6 (six) hours as needed.   labetalol 300 MG tablet Commonly known as: NORMODYNE Take 300 mg by mouth 2 (two) times daily.   NIFEdipine 30 MG 24 hr tablet Commonly known as: ADALAT CC Take 1 tablet (30 mg total) by mouth daily.   oxyCODONE 5 MG immediate release tablet Commonly known as: Oxy IR/ROXICODONE Take 1 tablet (5 mg total) by mouth every 4 (four) hours as needed for severe pain.   prenatal vitamin w/FE, FA 27-1 MG Tabs tablet Take 1 tablet by mouth daily at 12 noon.         Discharge home in stable condition Infant Feeding: Bottle and Breast Infant Disposition:home with mother Discharge instruction: per After Visit Summary and Postpartum booklet. Activity: Advance as tolerated. Pelvic rest for 6 weeks.  Diet: routine diet Anticipated Birth Control: Unsure Postpartum Appointment: 1 day BP check, Friday for BP recheck, labs, and woudn vac removal Additional Postpartum F/U:  see above Future Appointments:No future appointments. Follow up Visit:      08/31/2022 Tyson Dense, MD

## 2022-09-04 ENCOUNTER — Telehealth (HOSPITAL_COMMUNITY): Payer: Self-pay

## 2022-09-04 NOTE — Telephone Encounter (Signed)
Patient did not answer phone call. Voicemail left for patient.   Jody Griffin 09/04/2022,0923

## 2022-09-08 ENCOUNTER — Inpatient Hospital Stay (HOSPITAL_COMMUNITY): Payer: BC Managed Care – PPO
